# Patient Record
Sex: Female | Born: 1985 | Race: White | Hispanic: No | Marital: Single | Smoking: Current every day smoker
Health system: Southern US, Community
[De-identification: ages and names within clinical notes are randomized; demographics above are authoritative.]

## PROBLEM LIST (undated history)

## (undated) DIAGNOSIS — F419 Anxiety disorder, unspecified: Secondary | ICD-10-CM

## (undated) DIAGNOSIS — J45909 Unspecified asthma, uncomplicated: Secondary | ICD-10-CM

## (undated) DIAGNOSIS — N96 Recurrent pregnancy loss: Secondary | ICD-10-CM

## (undated) DIAGNOSIS — K219 Gastro-esophageal reflux disease without esophagitis: Secondary | ICD-10-CM

## (undated) HISTORY — PX: ABDOMINAL HYSTERECTOMY: SHX81

## (undated) HISTORY — PX: TUBAL LIGATION: SHX77

---

## 2010-02-12 ENCOUNTER — Emergency Department: Payer: Self-pay | Admitting: Unknown Physician Specialty

## 2011-01-08 ENCOUNTER — Inpatient Hospital Stay: Payer: Self-pay | Admitting: Internal Medicine

## 2011-01-11 ENCOUNTER — Emergency Department: Payer: Self-pay | Admitting: Unknown Physician Specialty

## 2011-06-26 ENCOUNTER — Emergency Department: Payer: Self-pay | Admitting: *Deleted

## 2011-08-16 ENCOUNTER — Emergency Department: Payer: Self-pay | Admitting: *Deleted

## 2011-08-16 LAB — PREGNANCY, URINE: Pregnancy Test, Urine: NEGATIVE m[IU]/mL

## 2011-08-16 LAB — COMPREHENSIVE METABOLIC PANEL
Alkaline Phosphatase: 74 U/L (ref 50–136)
Anion Gap: 12 (ref 7–16)
BUN: 13 mg/dL (ref 7–18)
Bilirubin,Total: 1.2 mg/dL — ABNORMAL HIGH (ref 0.2–1.0)
Calcium, Total: 9.1 mg/dL (ref 8.5–10.1)
Chloride: 108 mmol/L — ABNORMAL HIGH (ref 98–107)
Co2: 20 mmol/L — ABNORMAL LOW (ref 21–32)
Creatinine: 0.6 mg/dL (ref 0.60–1.30)
EGFR (African American): >60
Osmolality: 281 (ref 275–301)
Potassium: 5 mmol/L (ref 3.5–5.1)
SGPT (ALT): 17 U/L

## 2011-08-16 LAB — URINALYSIS, COMPLETE
Bilirubin,UR: NEGATIVE
Leukocyte Esterase: NEGATIVE
Ph: 5 (ref 4.5–8.0)
Protein: 30
RBC,UR: 1 /HPF (ref 0–5)
Specific Gravity: 1.026 (ref 1.003–1.030)
Squamous Epithelial: 29

## 2011-08-16 LAB — CBC
HGB: 15 g/dL (ref 12.0–16.0)
MCH: 32.4 pg (ref 26.0–34.0)
Platelet: 280 10*3/uL (ref 150–440)
RBC: 4.63 10*6/uL (ref 3.80–5.20)
RDW: 13.6 % (ref 11.5–14.5)

## 2011-08-16 LAB — LIPASE, BLOOD: Lipase: 115 U/L (ref 73–393)

## 2011-08-19 ENCOUNTER — Emergency Department: Payer: Self-pay | Admitting: Emergency Medicine

## 2011-08-19 LAB — URINALYSIS, COMPLETE
Bilirubin,UR: NEGATIVE
Blood: NEGATIVE
Glucose,UR: NEGATIVE mg/dL (ref 0–75)
Nitrite: NEGATIVE
RBC,UR: NONE SEEN /HPF (ref 0–5)
Squamous Epithelial: 41
WBC UR: 6 /HPF (ref 0–5)

## 2011-08-20 LAB — COMPREHENSIVE METABOLIC PANEL
Albumin: 2.8 g/dL — ABNORMAL LOW (ref 3.4–5.0)
Alkaline Phosphatase: 48 U/L — ABNORMAL LOW (ref 50–136)
Anion Gap: 10 (ref 7–16)
Bilirubin,Total: 0.4 mg/dL (ref 0.2–1.0)
Calcium, Total: 7.9 mg/dL — ABNORMAL LOW (ref 8.5–10.1)
Creatinine: 0.61 mg/dL (ref 0.60–1.30)
Glucose: 79 mg/dL (ref 65–99)
Osmolality: 282 (ref 275–301)
Potassium: 3.7 mmol/L (ref 3.5–5.1)
Sodium: 143 mmol/L (ref 136–145)
Total Protein: 6.4 g/dL (ref 6.4–8.2)

## 2011-08-20 LAB — CBC
HGB: 12.2 g/dL (ref 12.0–16.0)
MCHC: 33.9 g/dL (ref 32.0–36.0)
Platelet: 213 10*3/uL (ref 150–440)
RBC: 3.73 10*6/uL — ABNORMAL LOW (ref 3.80–5.20)
WBC: 6.9 10*3/uL (ref 3.6–11.0)

## 2011-08-20 LAB — PREGNANCY, URINE: Pregnancy Test, Urine: NEGATIVE m[IU]/mL

## 2011-12-24 ENCOUNTER — Emergency Department: Payer: Self-pay | Admitting: Emergency Medicine

## 2011-12-24 LAB — URINALYSIS, COMPLETE
Glucose,UR: NEGATIVE mg/dL (ref 0–75)
Nitrite: NEGATIVE
Ph: 9 (ref 4.5–8.0)
Protein: >=500
Specific Gravity: 1.027 (ref 1.003–1.030)
Squamous Epithelial: 2
WBC UR: 15 /HPF (ref 0–5)

## 2011-12-24 LAB — CBC
HCT: 43.5 % (ref 35.0–47.0)
MCH: 32.1 pg (ref 26.0–34.0)
MCHC: 33.3 g/dL (ref 32.0–36.0)
Platelet: 278 10*3/uL (ref 150–440)
RDW: 12.8 % (ref 11.5–14.5)

## 2011-12-24 LAB — COMPREHENSIVE METABOLIC PANEL
Anion Gap: 11 (ref 7–16)
BUN: 10 mg/dL (ref 7–18)
Chloride: 110 mmol/L — ABNORMAL HIGH (ref 98–107)
EGFR (African American): >60
EGFR (Non-African Amer.): >60
Glucose: 116 mg/dL — ABNORMAL HIGH (ref 65–99)
Osmolality: 281 (ref 275–301)
SGOT(AST): 11 U/L — ABNORMAL LOW (ref 15–37)
SGPT (ALT): 16 U/L
Total Protein: 8 g/dL (ref 6.4–8.2)

## 2011-12-24 LAB — LIPASE, BLOOD: Lipase: 66 U/L — ABNORMAL LOW (ref 73–393)

## 2012-01-05 ENCOUNTER — Emergency Department: Payer: Self-pay | Admitting: Emergency Medicine

## 2012-01-05 LAB — URINALYSIS, COMPLETE
Bacteria: NONE SEEN
Bilirubin,UR: NEGATIVE
Blood: NEGATIVE
Glucose,UR: NEGATIVE mg/dL (ref 0–75)
Ketone: NEGATIVE
Nitrite: NEGATIVE
Protein: NEGATIVE
Specific Gravity: 1.028 (ref 1.003–1.030)
WBC UR: NONE SEEN /HPF (ref 0–5)

## 2012-01-05 LAB — CBC
HGB: 12.9 g/dL (ref 12.0–16.0)
MCH: 32.1 pg (ref 26.0–34.0)
MCV: 98 fL (ref 80–100)
Platelet: 255 10*3/uL (ref 150–440)
RDW: 13 % (ref 11.5–14.5)
WBC: 10.1 10*3/uL (ref 3.6–11.0)

## 2012-01-05 LAB — HCG, QUANTITATIVE, PREGNANCY: Beta Hcg, Quant.: 195 m[IU]/mL — ABNORMAL HIGH

## 2012-01-05 LAB — WET PREP, GENITAL

## 2012-01-17 ENCOUNTER — Emergency Department: Payer: Self-pay | Admitting: Emergency Medicine

## 2012-02-25 ENCOUNTER — Emergency Department: Payer: Self-pay | Admitting: Emergency Medicine

## 2012-02-25 LAB — URINALYSIS, COMPLETE
Bacteria: NONE SEEN
Blood: NEGATIVE
Glucose,UR: NEGATIVE mg/dL (ref 0–75)
Leukocyte Esterase: NEGATIVE
Nitrite: NEGATIVE
Ph: 7 (ref 4.5–8.0)
Protein: NEGATIVE
WBC UR: 1 /HPF (ref 0–5)

## 2012-02-25 LAB — CBC
HCT: 36.3 % (ref 35.0–47.0)
HGB: 12.3 g/dL (ref 12.0–16.0)
MCH: 32.3 pg (ref 26.0–34.0)
RBC: 3.8 10*6/uL (ref 3.80–5.20)
WBC: 7.3 10*3/uL (ref 3.6–11.0)

## 2012-02-25 LAB — HCG, QUANTITATIVE, PREGNANCY: Beta Hcg, Quant.: 92467 m[IU]/mL — ABNORMAL HIGH

## 2012-03-18 ENCOUNTER — Emergency Department: Payer: Self-pay | Admitting: Emergency Medicine

## 2012-03-18 LAB — URINALYSIS, COMPLETE
Bilirubin,UR: NEGATIVE
Blood: NEGATIVE
Ketone: NEGATIVE
Ph: 6 (ref 4.5–8.0)
Protein: NEGATIVE
Specific Gravity: 1.025 (ref 1.003–1.030)
Squamous Epithelial: 20
WBC UR: 2 /HPF (ref 0–5)

## 2012-04-03 ENCOUNTER — Emergency Department: Payer: Self-pay | Admitting: Emergency Medicine

## 2012-04-03 LAB — COMPREHENSIVE METABOLIC PANEL
Alkaline Phosphatase: 78 U/L (ref 50–136)
Bilirubin,Total: 0.5 mg/dL (ref 0.2–1.0)
Chloride: 106 mmol/L (ref 98–107)
Co2: 24 mmol/L (ref 21–32)
Creatinine: 0.46 mg/dL — ABNORMAL LOW (ref 0.60–1.30)
EGFR (African American): >60
EGFR (Non-African Amer.): >60
Glucose: 69 mg/dL (ref 65–99)
SGOT(AST): 24 U/L (ref 15–37)
SGPT (ALT): 15 U/L (ref 12–78)
Total Protein: 7.3 g/dL (ref 6.4–8.2)

## 2012-04-03 LAB — URINALYSIS, COMPLETE
Bilirubin,UR: NEGATIVE
Ketone: NEGATIVE
Leukocyte Esterase: NEGATIVE
Nitrite: NEGATIVE
Ph: 8 (ref 4.5–8.0)
Protein: NEGATIVE
RBC,UR: NONE SEEN /HPF (ref 0–5)
Specific Gravity: 1.015 (ref 1.003–1.030)

## 2012-04-03 LAB — CBC
HCT: 35.4 % (ref 35.0–47.0)
MCH: 33 pg (ref 26.0–34.0)
MCHC: 34.9 g/dL (ref 32.0–36.0)
MCV: 95 fL (ref 80–100)
RBC: 3.75 10*6/uL — ABNORMAL LOW (ref 3.80–5.20)
RDW: 12.5 % (ref 11.5–14.5)

## 2012-04-03 LAB — WET PREP, GENITAL

## 2012-04-03 LAB — HCG, QUANTITATIVE, PREGNANCY: Beta Hcg, Quant.: 28913 m[IU]/mL — ABNORMAL HIGH

## 2012-04-03 LAB — LIPASE, BLOOD: Lipase: 77 U/L (ref 73–393)

## 2012-05-06 ENCOUNTER — Emergency Department: Payer: Self-pay | Admitting: *Deleted

## 2012-05-07 ENCOUNTER — Emergency Department: Payer: Self-pay | Admitting: Emergency Medicine

## 2012-05-07 LAB — URINALYSIS, COMPLETE
Bilirubin,UR: NEGATIVE
Blood: NEGATIVE
Ph: 6 (ref 4.5–8.0)
Protein: NEGATIVE
RBC,UR: 7 /HPF (ref 0–5)
Specific Gravity: 1.024 (ref 1.003–1.030)

## 2012-06-25 ENCOUNTER — Observation Stay: Payer: Self-pay

## 2012-07-11 ENCOUNTER — Emergency Department: Payer: Self-pay | Admitting: Emergency Medicine

## 2012-07-11 LAB — URINALYSIS, COMPLETE
Blood: NEGATIVE
Ketone: NEGATIVE
Leukocyte Esterase: NEGATIVE
Nitrite: NEGATIVE
Ph: 7 (ref 4.5–8.0)
Protein: NEGATIVE
RBC,UR: 2 /HPF (ref 0–5)
Specific Gravity: 1.019 (ref 1.003–1.030)
Squamous Epithelial: 9

## 2012-07-11 LAB — CBC WITH DIFFERENTIAL/PLATELET
Basophil #: 0 10*3/uL (ref 0.0–0.1)
Basophil %: 0.3 %
HCT: 35.9 % (ref 35.0–47.0)
HGB: 12.5 g/dL (ref 12.0–16.0)
Lymphocyte #: 1.6 10*3/uL (ref 1.0–3.6)
MCHC: 35 g/dL (ref 32.0–36.0)
MCV: 97 fL (ref 80–100)
Monocyte %: 5.9 %
Neutrophil #: 6.6 10*3/uL — ABNORMAL HIGH (ref 1.4–6.5)
Neutrophil %: 74.4 %
RBC: 3.69 10*6/uL — ABNORMAL LOW (ref 3.80–5.20)

## 2012-07-11 LAB — COMPREHENSIVE METABOLIC PANEL
Albumin: 2.4 g/dL — ABNORMAL LOW (ref 3.4–5.0)
Alkaline Phosphatase: 88 U/L (ref 50–136)
Anion Gap: 6 — ABNORMAL LOW (ref 7–16)
Bilirubin,Total: 0.3 mg/dL (ref 0.2–1.0)
Calcium, Total: 8.3 mg/dL — ABNORMAL LOW (ref 8.5–10.1)
Co2: 24 mmol/L (ref 21–32)
EGFR (African American): >60
EGFR (Non-African Amer.): >60
Osmolality: 272 (ref 275–301)
SGOT(AST): 24 U/L (ref 15–37)
Sodium: 137 mmol/L (ref 136–145)
Total Protein: 6.2 g/dL — ABNORMAL LOW (ref 6.4–8.2)

## 2012-07-14 ENCOUNTER — Inpatient Hospital Stay: Payer: Self-pay | Admitting: Obstetrics and Gynecology

## 2012-07-14 LAB — CBC
HCT: 43.6 % (ref 35.0–47.0)
MCH: 33.1 pg (ref 26.0–34.0)
MCHC: 33.9 g/dL (ref 32.0–36.0)
MCV: 98 fL (ref 80–100)
Platelet: 158 10*3/uL (ref 150–440)
RBC: 4.46 10*6/uL (ref 3.80–5.20)
RDW: 13.1 % (ref 11.5–14.5)
WBC: 13.9 10*3/uL — ABNORMAL HIGH (ref 3.6–11.0)

## 2012-07-14 LAB — URINALYSIS, COMPLETE
Blood: NEGATIVE
Leukocyte Esterase: NEGATIVE
Protein: 100
RBC,UR: 2 /HPF (ref 0–5)
Specific Gravity: 1.029 (ref 1.003–1.030)

## 2012-07-14 LAB — COMPREHENSIVE METABOLIC PANEL
Alkaline Phosphatase: 118 U/L (ref 50–136)
Anion Gap: 12 (ref 7–16)
Bilirubin,Total: 0.4 mg/dL (ref 0.2–1.0)
Co2: 18 mmol/L — ABNORMAL LOW (ref 21–32)
EGFR (African American): >60
Osmolality: 274 (ref 275–301)
SGPT (ALT): 64 U/L (ref 12–78)
Sodium: 137 mmol/L (ref 136–145)
Total Protein: 7.9 g/dL (ref 6.4–8.2)

## 2012-07-14 LAB — RAPID URINE DRUG SCREEN, HOSP PERFORMED
Amphetamines, Ur Screen: NEGATIVE (ref ?–1000)
Barbiturates, Ur Screen: NEGATIVE (ref ?–200)
Benzodiazepine, Ur Scrn: NEGATIVE (ref ?–200)
Cannabinoid 50 Ng, Ur ~~LOC~~: POSITIVE (ref ?–50)
Cocaine Metabolite,Ur ~~LOC~~: NEGATIVE (ref ?–300)
Opiate, Ur Screen: NEGATIVE (ref ?–300)

## 2012-07-14 LAB — LIPASE, BLOOD: Lipase: 239 U/L (ref 73–393)

## 2012-07-14 LAB — PROTEIN / CREATININE RATIO, URINE
Creatinine, Urine: 193.1 mg/dL — ABNORMAL HIGH (ref 30.0–125.0)
Protein/Creat. Ratio: 186 mg/gCREAT (ref 0–200)

## 2012-07-14 LAB — RAPID INFLUENZA A&B ANTIGENS

## 2012-07-15 LAB — COMPREHENSIVE METABOLIC PANEL
Albumin: 2.2 g/dL — ABNORMAL LOW (ref 3.4–5.0)
Alkaline Phosphatase: 76 U/L (ref 50–136)
BUN: 14 mg/dL (ref 7–18)
Chloride: 111 mmol/L — ABNORMAL HIGH (ref 98–107)
Co2: 20 mmol/L — ABNORMAL LOW (ref 21–32)
EGFR (African American): >60
Glucose: 103 mg/dL — ABNORMAL HIGH (ref 65–99)
Osmolality: 286 (ref 275–301)
Potassium: 3.7 mmol/L (ref 3.5–5.1)
SGOT(AST): 41 U/L — ABNORMAL HIGH (ref 15–37)
SGPT (ALT): 41 U/L (ref 12–78)
Sodium: 143 mmol/L (ref 136–145)
Total Protein: 5.3 g/dL — ABNORMAL LOW (ref 6.4–8.2)

## 2012-07-15 LAB — CBC WITH DIFFERENTIAL/PLATELET
Basophil #: 0 10*3/uL (ref 0.0–0.1)
Basophil %: 0.1 %
HCT: 31.3 % — ABNORMAL LOW (ref 35.0–47.0)
Lymphocyte #: 1.2 10*3/uL (ref 1.0–3.6)
Lymphocyte %: 5.4 %
MCH: 33.2 pg (ref 26.0–34.0)
MCHC: 34 g/dL (ref 32.0–36.0)
MCV: 98 fL (ref 80–100)
Monocyte #: 0.7 x10 3/mm (ref 0.2–0.9)
Monocyte %: 3.2 %
Neutrophil %: 91.3 %
Platelet: 133 10*3/uL — ABNORMAL LOW (ref 150–440)
RBC: 3.21 10*6/uL — ABNORMAL LOW (ref 3.80–5.20)
RDW: 13.1 % (ref 11.5–14.5)

## 2012-07-15 LAB — URIC ACID: Uric Acid: 4.2 mg/dL (ref 2.6–6.0)

## 2012-07-16 LAB — PATHOLOGY REPORT

## 2012-12-31 IMAGING — CR DG ABDOMEN 3V
1 series · 5 of 5 positions shown · non-contrast
Comparison: none

REASON FOR EXAM: pain, vomiting
COMMENTS:

[Series 1: w chest pa · 0.14mm/px · 5 of 5 slices shown]
[im 1/5]
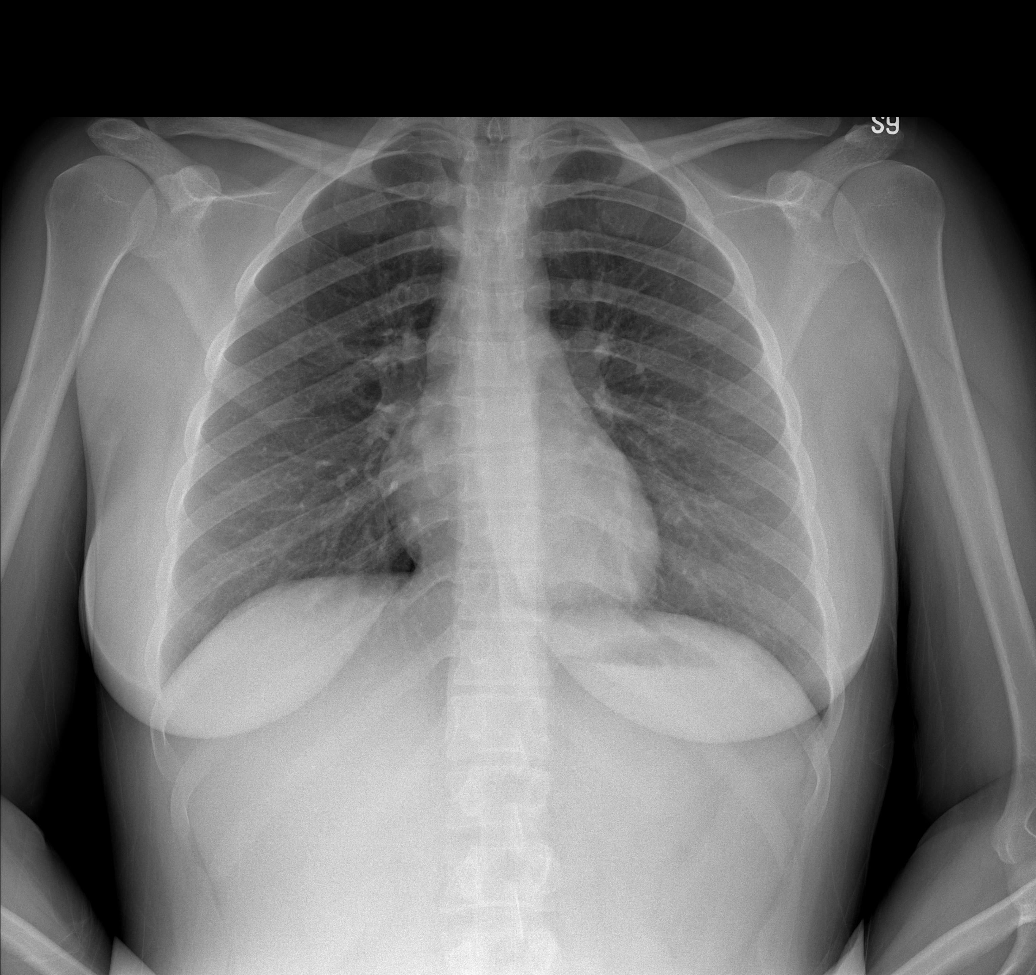
[im 2/5]
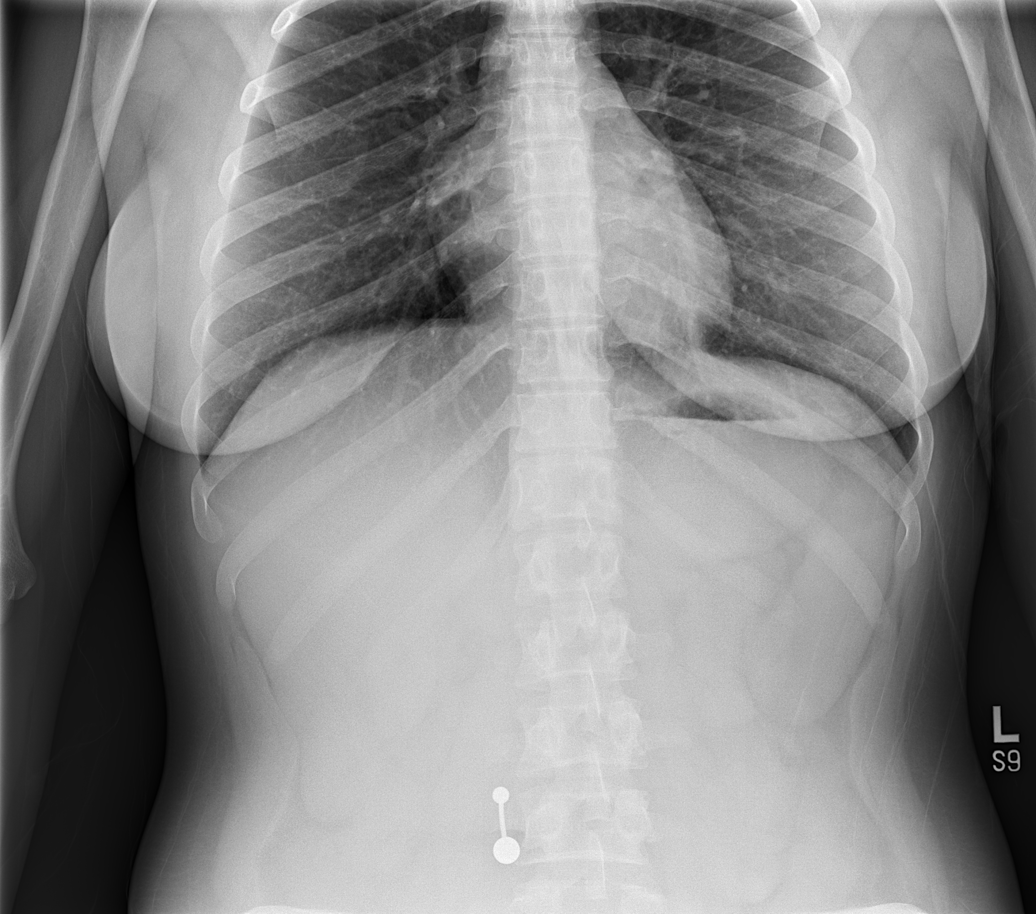
[im 3/5]
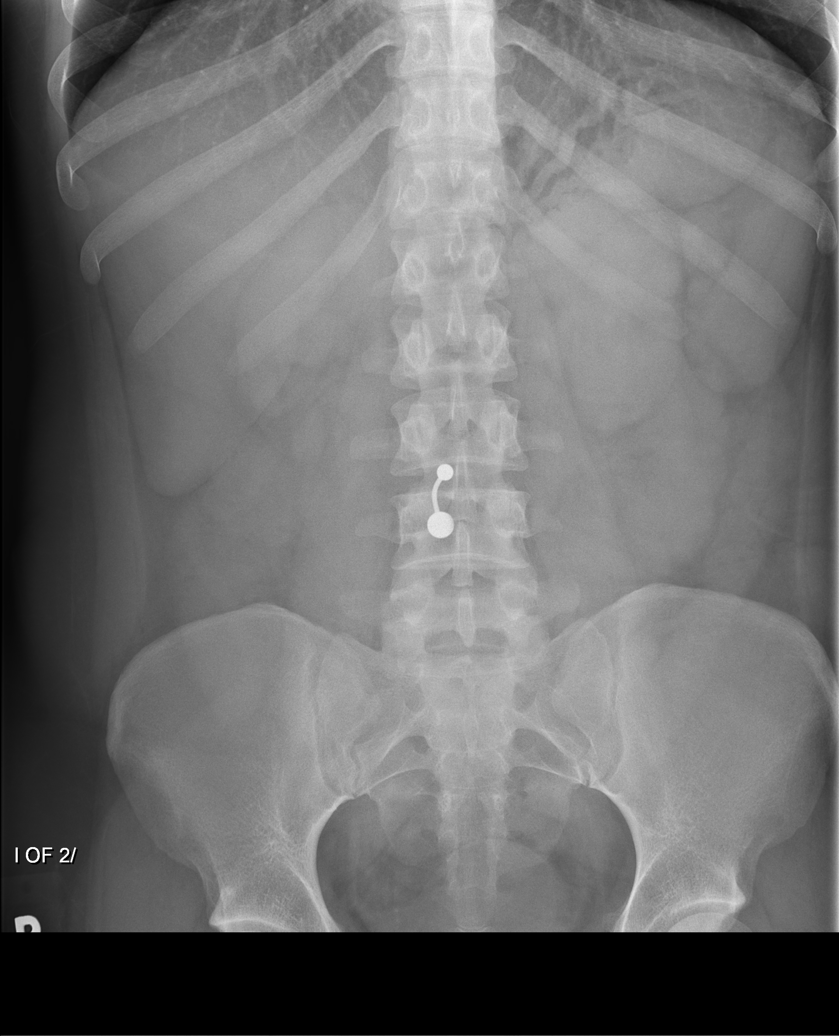
[im 4/5]
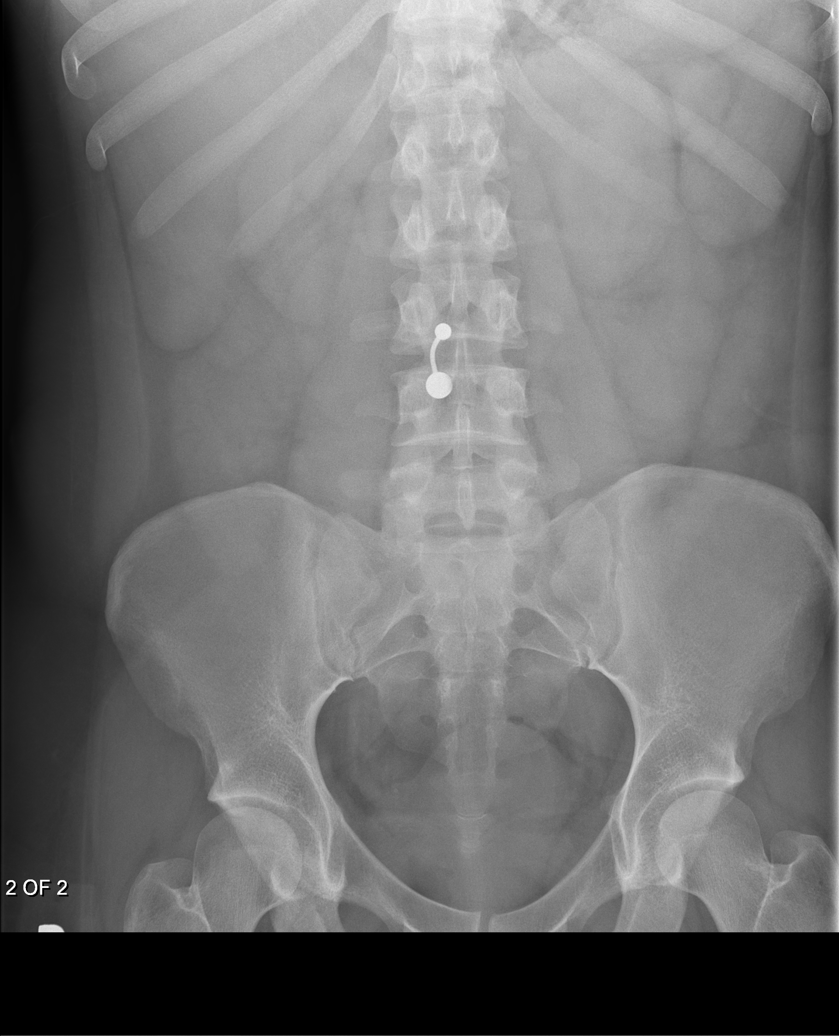
[im 5/5]
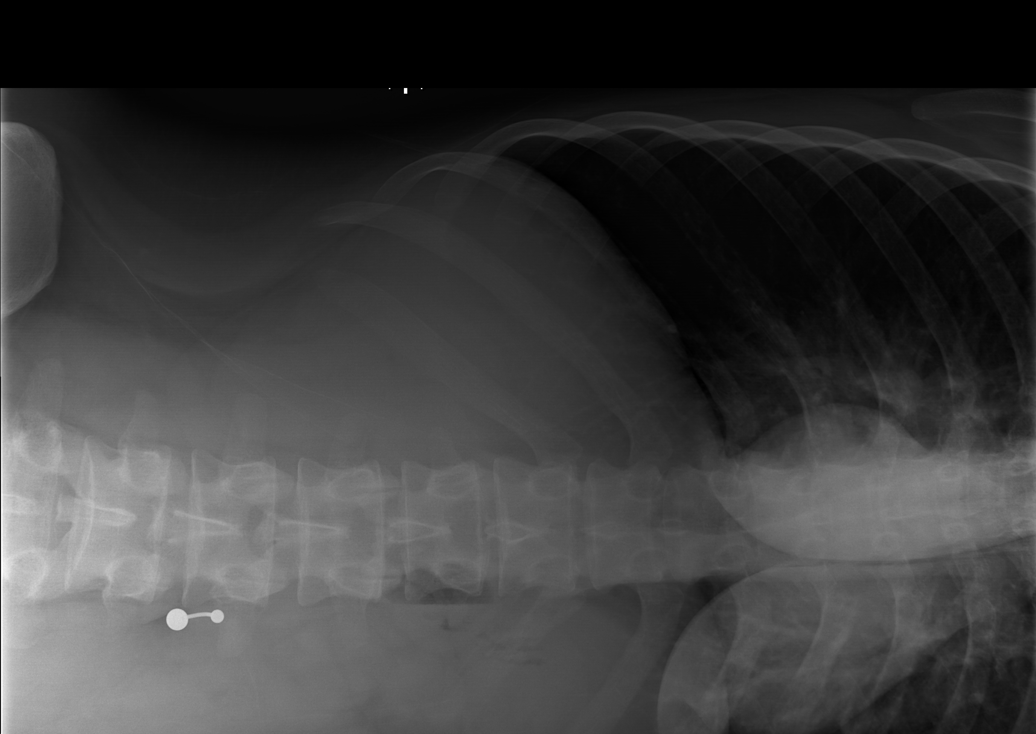

[5 of 5 positions shown; findings below may reference images not displayed]

PROCEDURE:     DXR - DXR ABDOMEN 3-WAY (INCL PA CXR)  - August 16, 2011  [DATE]

RESULT:     Comparison is made to the prior exam of 01/08/2011. The lung
fields are clear. Multiple views of the abdomen were obtained. No
subdiaphragmatic free air is seen. No dilated bowel loops are noted.
Overall, there is relative decrease in bowel gas. This is a nonspecific
finding that can be transient, secondary to gastric outlet obstruction or
associated with intra-abdominal inflammation such as pancreatitis. The
stomach appears fluid-filled with an air-fluid level noted in the gastric
fundus. No abnormal intraabdominal calcifications are seen. The osseous
structures are normal in appearance.
IMPRESSION: 1. The lung fields are clear.
2. No subdiaphragmatic free air is observed.
3. The abdomen is essentially airless. This finding is nonspecific but can
be associated with pancreatitis or with gastric outlet obstruction which are
mentioned for correlation with clinical and laboratory findings.

## 2013-03-19 LAB — URINALYSIS, COMPLETE
Bacteria: NONE SEEN
Blood: NEGATIVE
Glucose,UR: NEGATIVE mg/dL (ref 0–75)
Ketone: NEGATIVE
Ph: 6 (ref 4.5–8.0)
Protein: NEGATIVE
RBC,UR: 1 /HPF (ref 0–5)
Squamous Epithelial: 8

## 2013-03-19 LAB — COMPREHENSIVE METABOLIC PANEL
Albumin: 2.9 g/dL — ABNORMAL LOW (ref 3.4–5.0)
Anion Gap: 6 — ABNORMAL LOW (ref 7–16)
Bilirubin,Total: 0.2 mg/dL (ref 0.2–1.0)
Calcium, Total: 8.9 mg/dL (ref 8.5–10.1)
Co2: 24 mmol/L (ref 21–32)
Creatinine: 0.57 mg/dL — ABNORMAL LOW (ref 0.60–1.30)
EGFR (African American): >60
EGFR (Non-African Amer.): >60
Glucose: 93 mg/dL (ref 65–99)
Osmolality: 270 (ref 275–301)
Potassium: 3.5 mmol/L (ref 3.5–5.1)
Total Protein: 6.7 g/dL (ref 6.4–8.2)

## 2013-03-19 LAB — HCG, QUANTITATIVE, PREGNANCY: Beta Hcg, Quant.: 54663 m[IU]/mL — ABNORMAL HIGH

## 2013-03-19 LAB — CBC
HCT: 38 % (ref 35.0–47.0)
HGB: 13.2 g/dL (ref 12.0–16.0)
MCH: 32.2 pg (ref 26.0–34.0)
MCV: 92 fL (ref 80–100)
Platelet: 270 10*3/uL (ref 150–440)
RBC: 4.11 10*6/uL (ref 3.80–5.20)
WBC: 10.8 10*3/uL (ref 3.6–11.0)

## 2013-03-19 LAB — LIPASE, BLOOD: Lipase: 116 U/L (ref 73–393)

## 2013-03-20 ENCOUNTER — Ambulatory Visit: Payer: Self-pay | Admitting: Obstetrics and Gynecology

## 2013-05-05 DIAGNOSIS — F4481 Dissociative identity disorder: Secondary | ICD-10-CM

## 2013-05-05 DIAGNOSIS — F319 Bipolar disorder, unspecified: Secondary | ICD-10-CM

## 2013-05-05 DIAGNOSIS — N938 Other specified abnormal uterine and vaginal bleeding: Secondary | ICD-10-CM

## 2013-05-05 DIAGNOSIS — F39 Unspecified mood [affective] disorder: Secondary | ICD-10-CM

## 2013-05-05 DIAGNOSIS — F419 Anxiety disorder, unspecified: Secondary | ICD-10-CM | POA: Insufficient documentation

## 2013-05-05 DIAGNOSIS — R519 Headache, unspecified: Secondary | ICD-10-CM | POA: Insufficient documentation

## 2013-05-05 DIAGNOSIS — F4310 Post-traumatic stress disorder, unspecified: Secondary | ICD-10-CM | POA: Insufficient documentation

## 2013-05-05 HISTORY — DX: Bipolar disorder, unspecified: F31.9

## 2013-05-05 HISTORY — DX: Unspecified mood (affective) disorder: F39

## 2013-05-05 HISTORY — DX: Other specified abnormal uterine and vaginal bleeding: N93.8

## 2013-05-05 HISTORY — DX: Dissociative identity disorder: F44.81

## 2013-05-05 HISTORY — DX: Post-traumatic stress disorder, unspecified: F43.10

## 2013-07-17 IMAGING — US US OB LIMITED
1 series · 14 of 28 positions shown · non-contrast
Comparison: none

REASON FOR EXAM: fetal growth
COMMENTS:   May transport without cardiac monitor

PROCEDURE:     US  - US LIMITED OB  - July 14, 2012 [DATE]
RESULT:

[Series 1: us ob limited · 0.25mm/px · 14 of 29 slices shown]
[im 2/29]
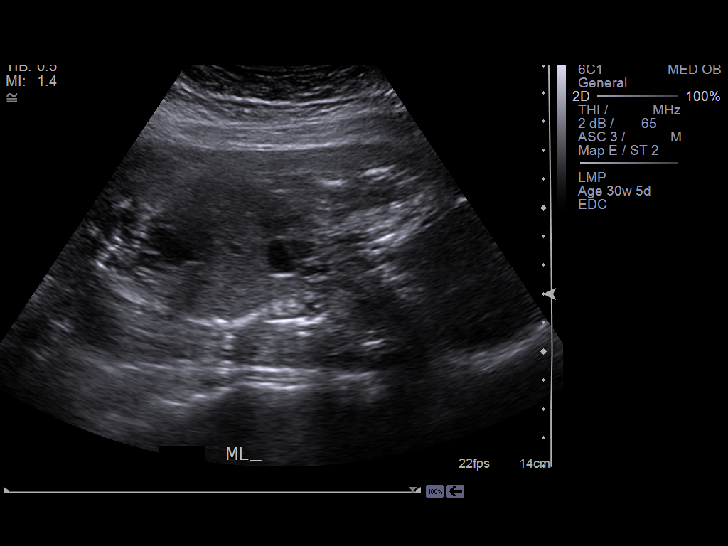
[im 4/29]
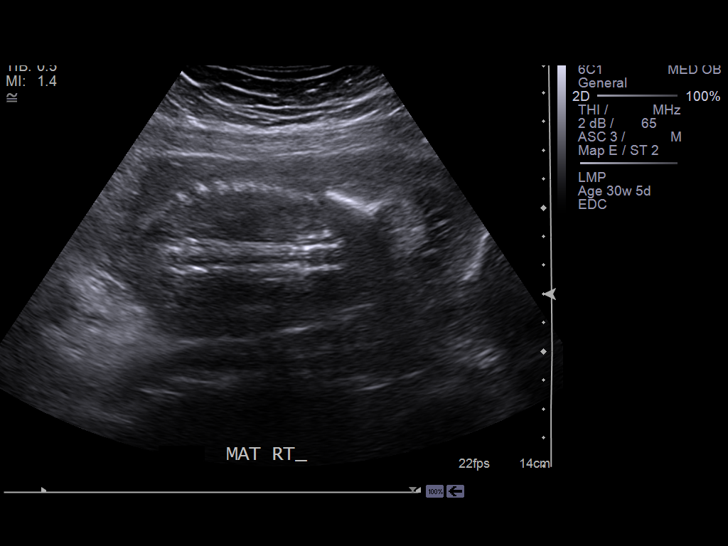
[im 6/29]
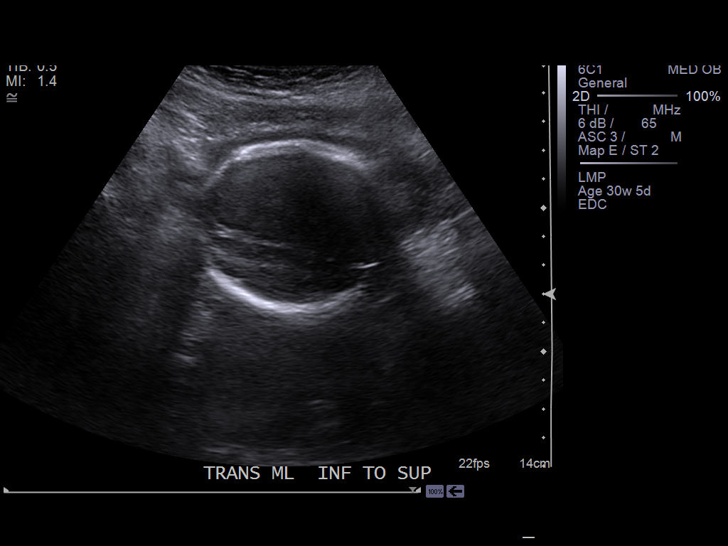
[im 8/29]
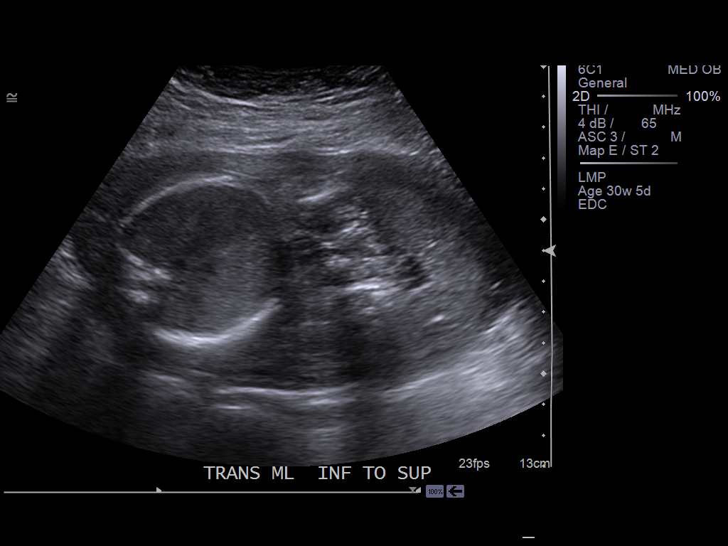
[im 10/29]
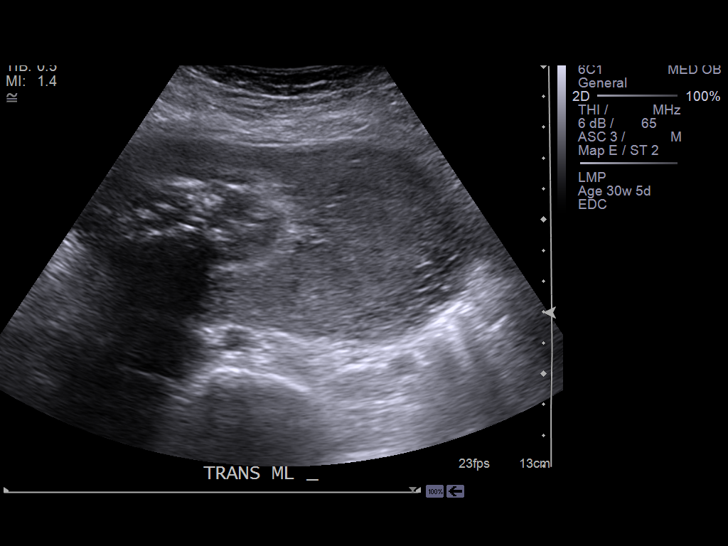
[im 12/29]
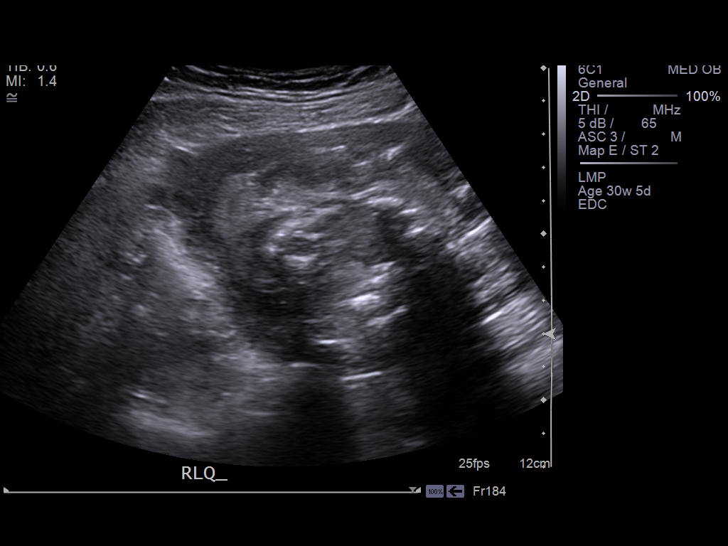
[im 14/29]
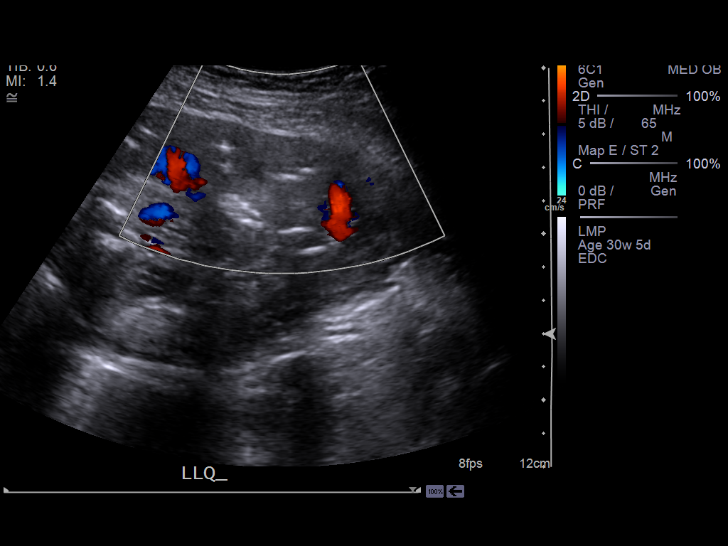
[im 16/29]
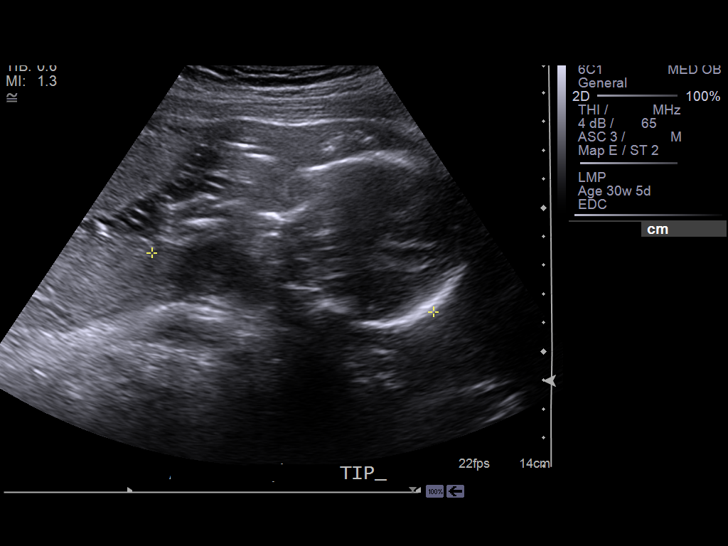
[im 18/29]
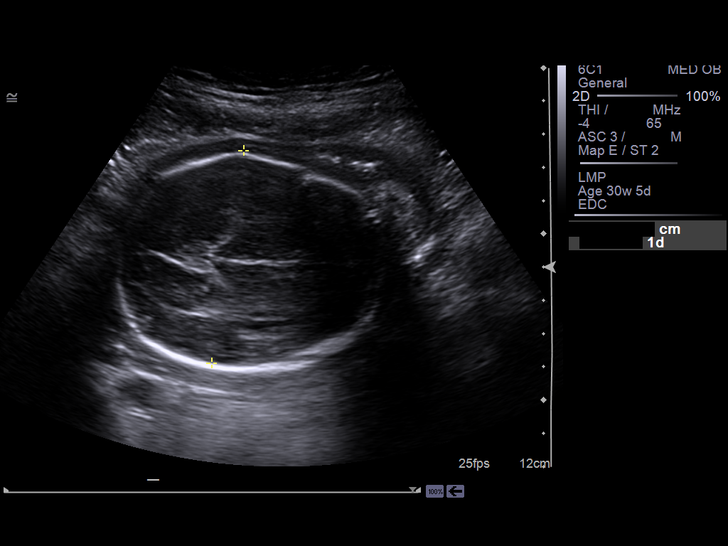
[im 20/29]
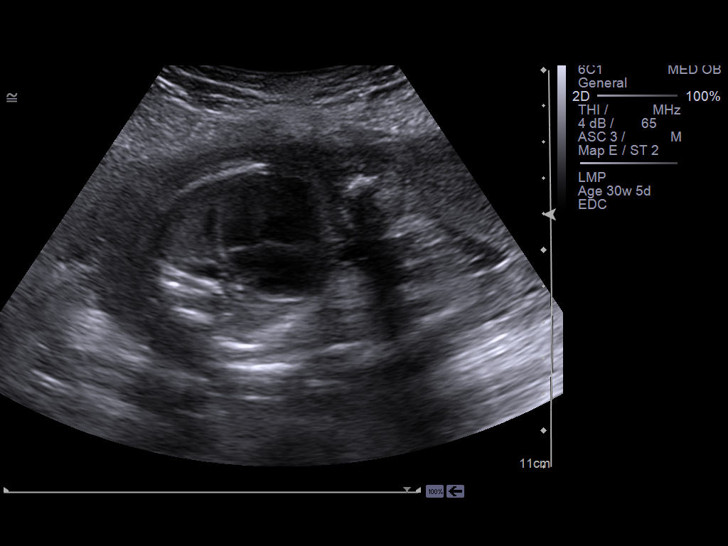
[im 22/29]
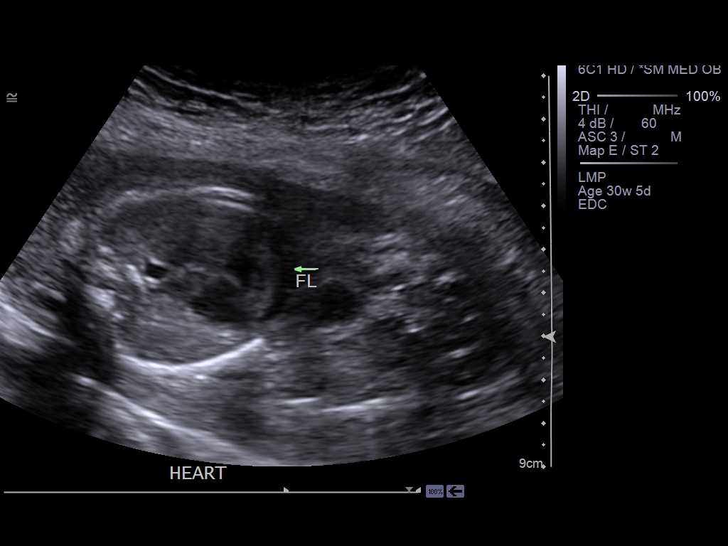
[im 24/29]
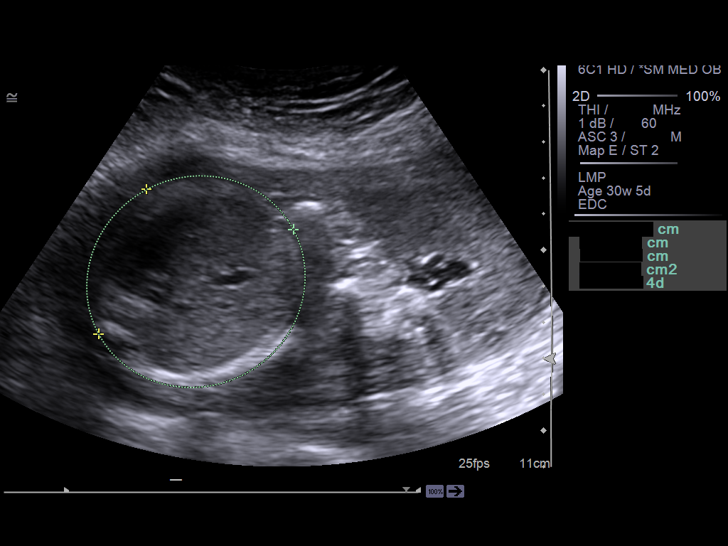
[im 26/29]
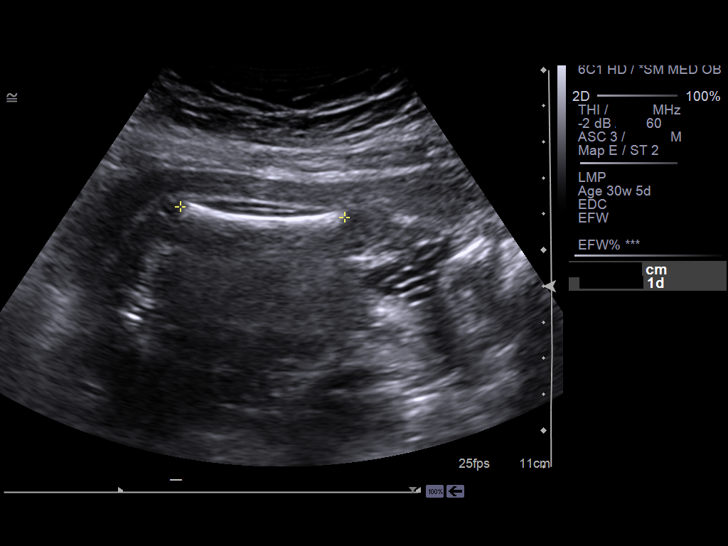
[im 29/29]
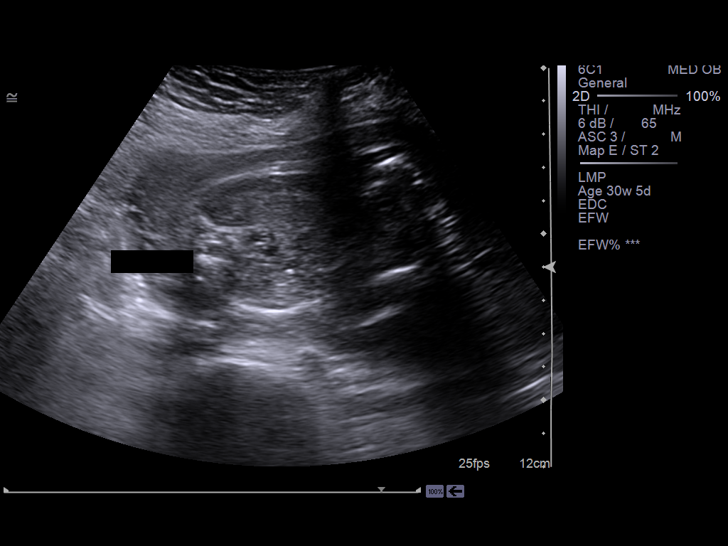

[14 of 28 positions shown; findings below may reference images not displayed]

FINDINGS: Limited Pelvic Ultrasound is performed. Transabdominal limited OB
assessment shows no amniotic fluid present consistent with severe
oligohydramnios. The cervix is closed with a length of 3.11 cm. The placenta
is fundal and to the patient's left measuring at least 10 cm from the most
inferior margin to the internal cervical os. Fetal heart rate is present
with a rate of 160 beats per minute. There is currently a cephalic
presentation. Current fetal measurements indicate gestational age of 25
weeks, 1 day with an ultrasound EDC 26 October, 2012 which is less than the
expected gestational age of 30 weeks, 5 days. Fetal weight is 667 grams + / -
99 grams by estimate. The fetal heart appears to be enlarged with
pericardial fluid present.
IMPRESSION: 1.  Severe oligohydramnios.
2.  Evidence of intrauterine growth retardation is present.
3.  Cardiomegaly and pericardial fluid present. High-risk OB assessment with
consideration of transfer to a tertiary care center is suggested.

[REDACTED]

## 2013-08-19 IMAGING — US US OB LIMITED
1 series · 13 of 28 positions shown · non-contrast
Comparison: none

REASON FOR EXAM: abd pain, discharge
COMMENTS:   May transport without cardiac monitor

[Series 1: us ob limited · 0.26mm/px · 13 of 43 slices shown]
[im 2/43]
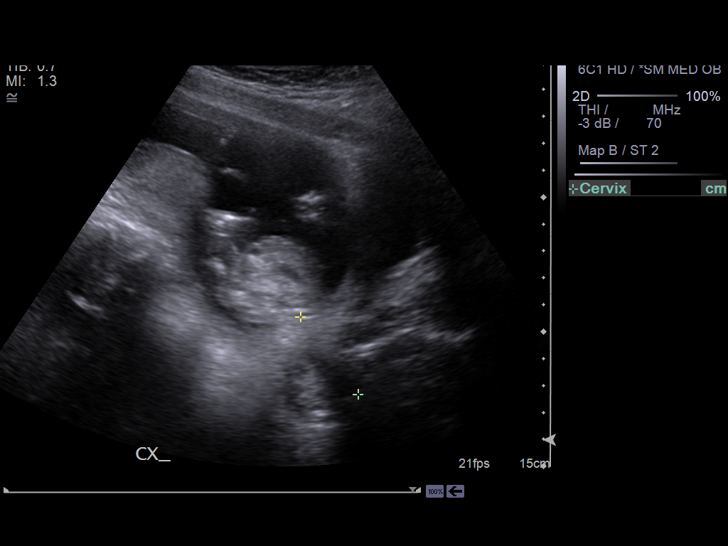
[im 5/43]
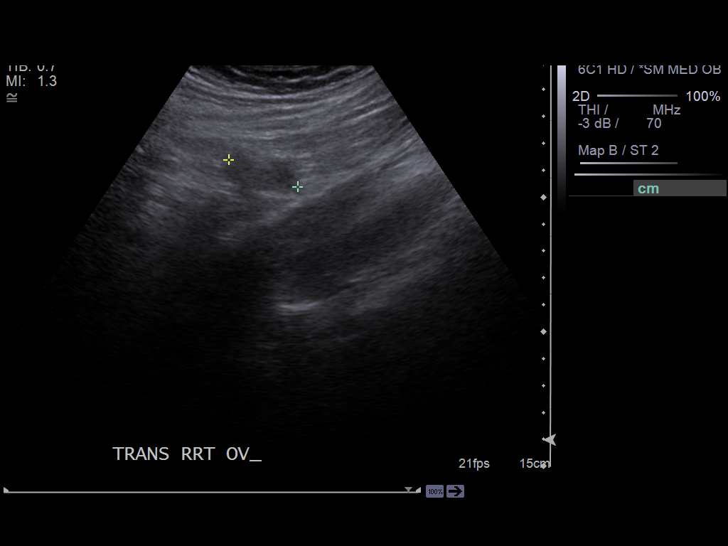
[im 8/43]
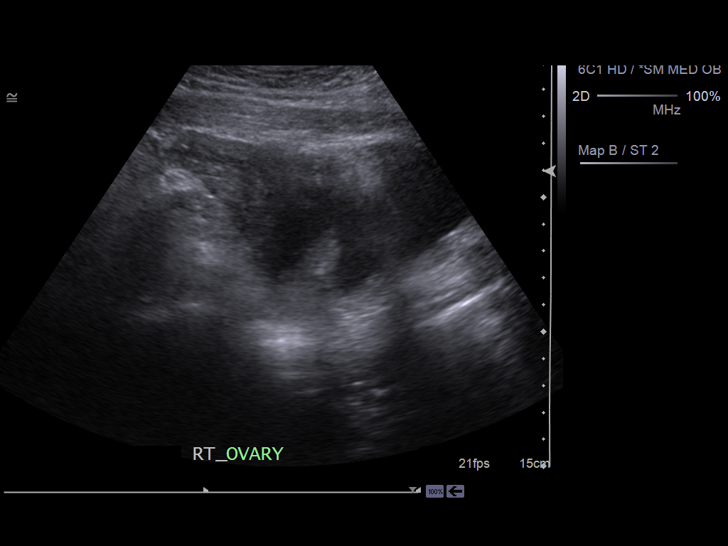
[im 11/43]
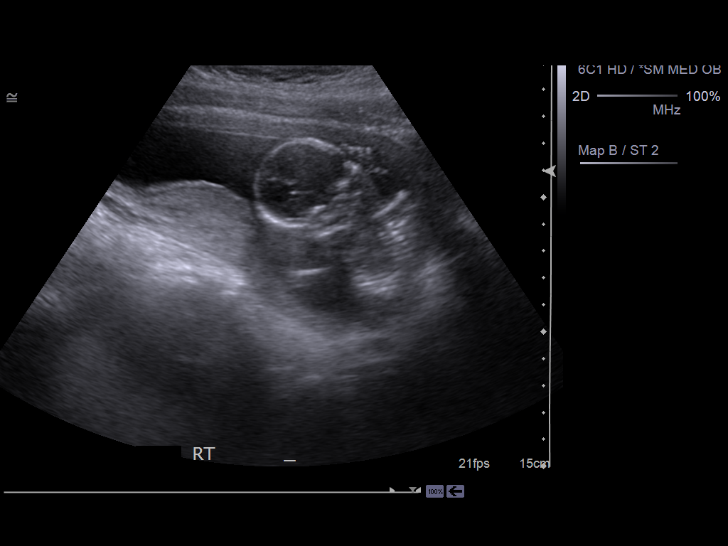
[im 15/43]
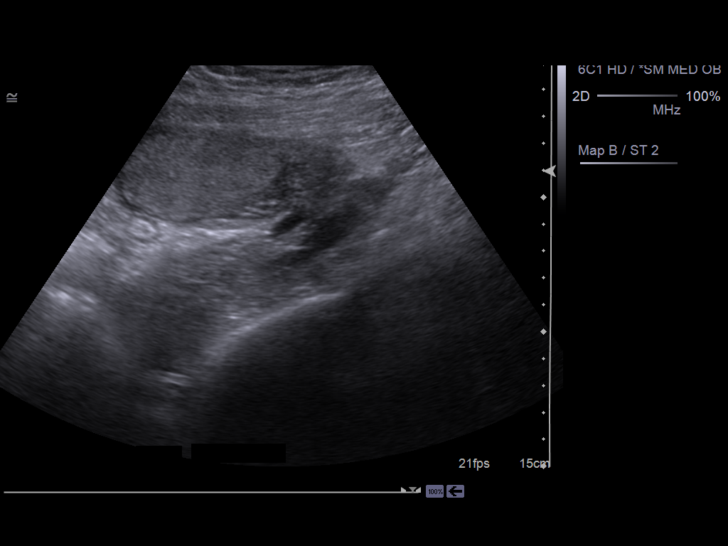
[im 18/43]
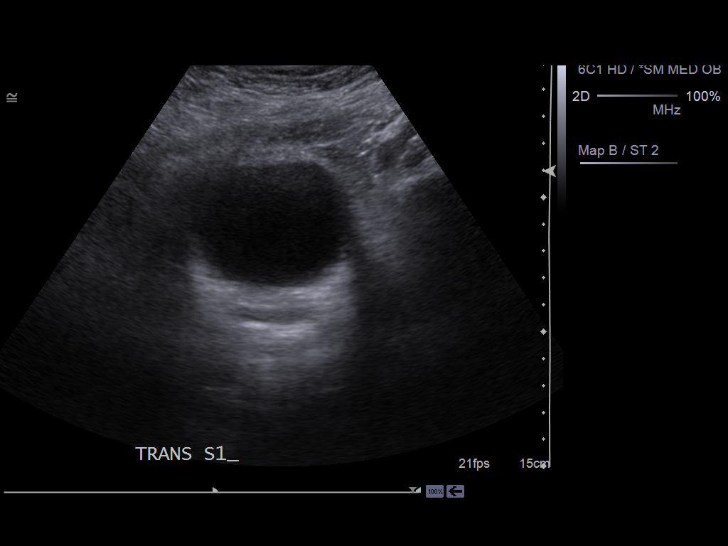
[im 22/43]
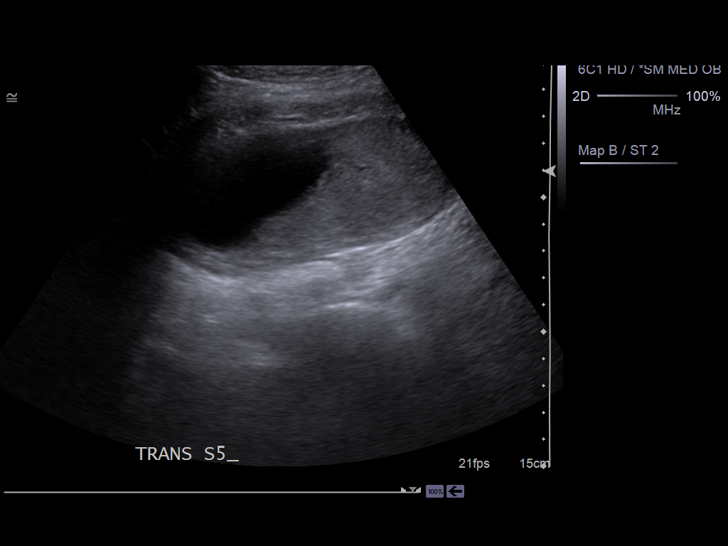
[im 25/43]
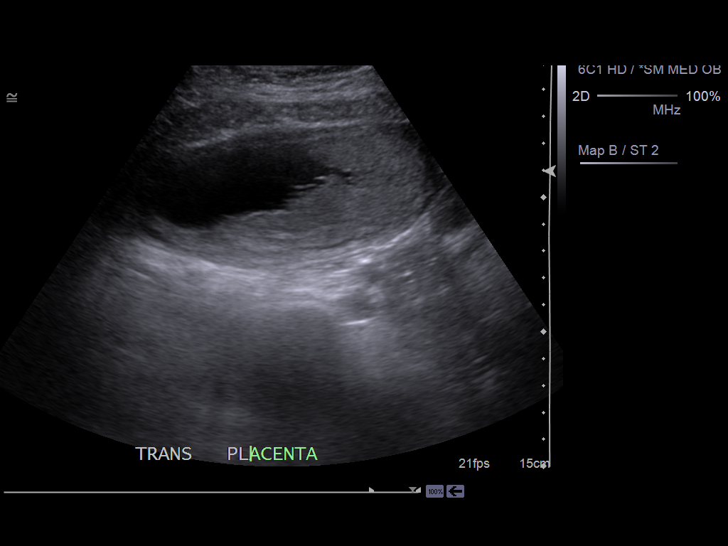
[im 29/43]
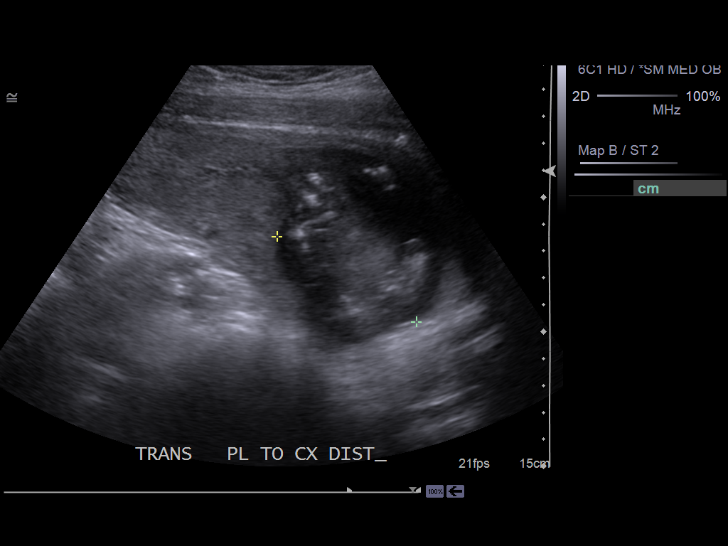
[im 32/43]
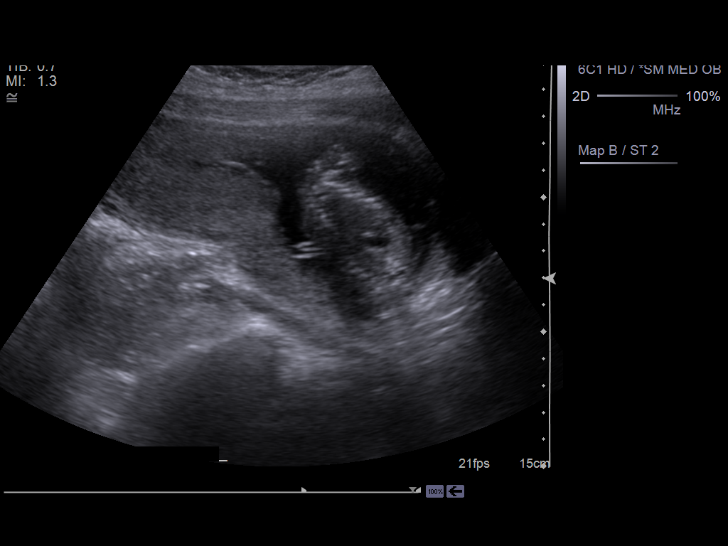
[im 35/43]
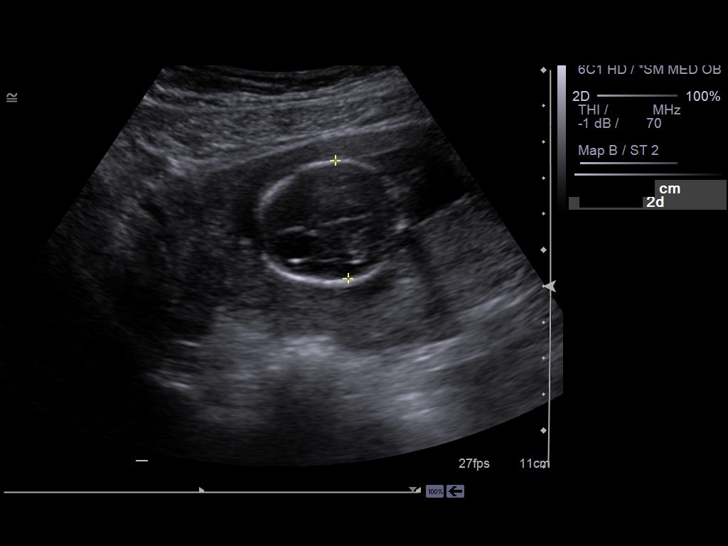
[im 38/43]
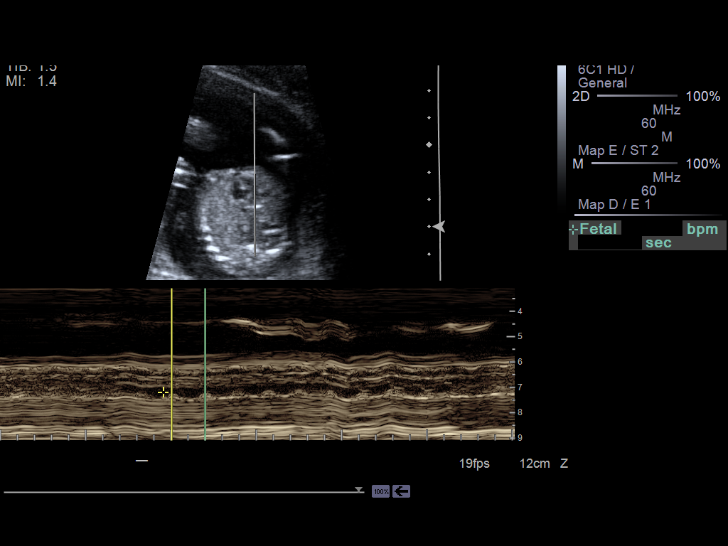
[im 41/43]
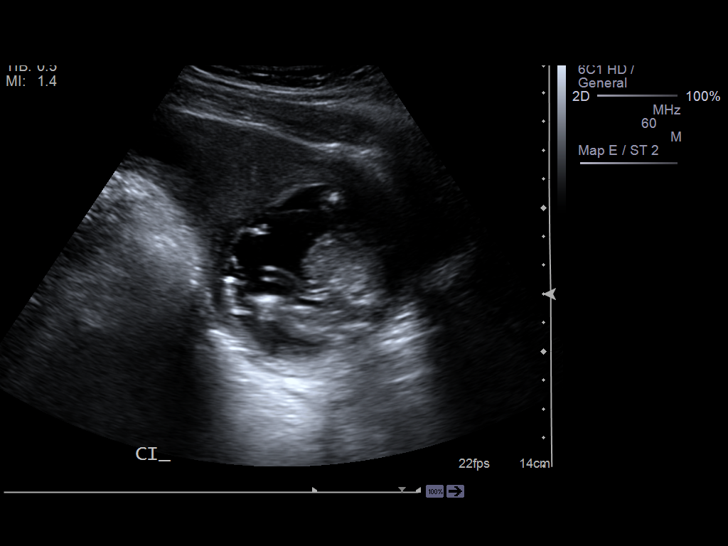

[13 of 28 positions shown; findings below may reference images not displayed]

PROCEDURE:     US  - US LIMITED OB  - April 03, 2012  [DATE]

RESULT:     The patient is reportedly 20 weeks pregnant and a limited OB
ultrasound for dating was requested.

There is a gravid uterus. The presentation is variable. The placenta is
fundal and left-sided. There is no evidence of a placenta previa. The
internal cervical os appears closed. The distance from the lower placental
segment to the internal cervical os is 6 cm.

Measured parameters:

BPD 3.33 cm corresponding to an EGA of 16 weeks 2 days
HC 12.28 cm corresponding to an EGA of 16 weeks one day
AC 10.4 there is centimeters corresponding to an EGA of 16 weeks 3 days
[HOSPITAL].09 cm corresponding to an EGA of 16 weeks 2 days
HL 1.95 cm corresponding to an EGA of 15 weeks 5 days
Estimated fetal weight 151 grams + / - 22 grams.

There is a cardiac rate of 153 beats per minute.
IMPRESSION: There is a viable IUP. The fetal presentation is variable.
The estimated gestational age is 16 weeks 1 day + / -10 days corresponding to
an estimated date of confinement 17 September, 2012. There is no evidence of
placental abruption.

## 2013-08-19 IMAGING — US ABDOMEN ULTRASOUND LIMITED
1 series · 14 of 25 positions shown · non-contrast
Comparison: none

REASON FOR EXAM: abd pain - all over, but mostly upper similar to prior
pancreatitis due to C dif
COMMENTS:   Body Site: GB and Fossa, CBD, Head of Pancreas

[Series 1: abdomen ultrasound limited · 0.31mm/px · 14 of 29 slices shown]
[im 1/29]
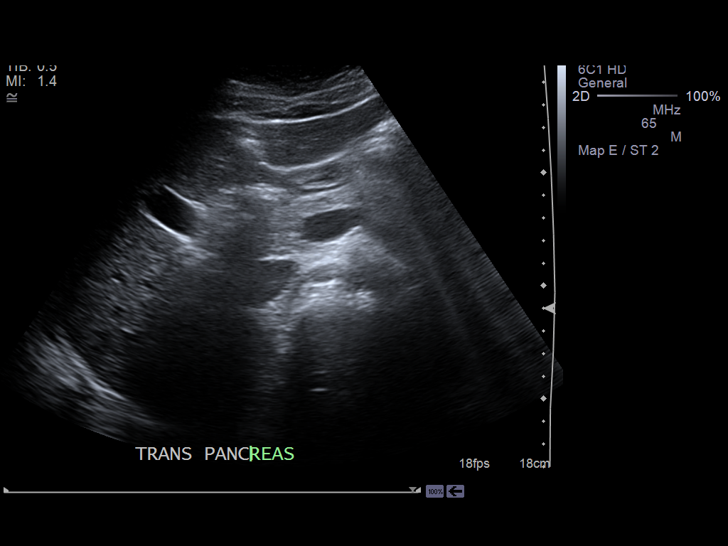
[im 3/29]
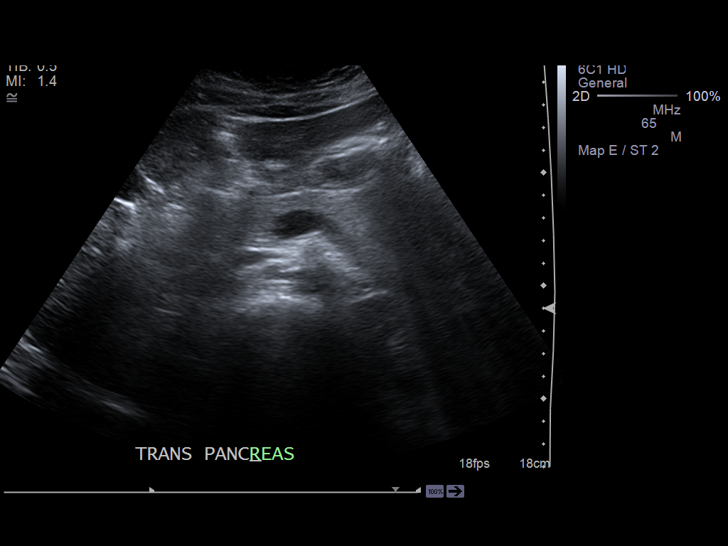
[im 5/29]
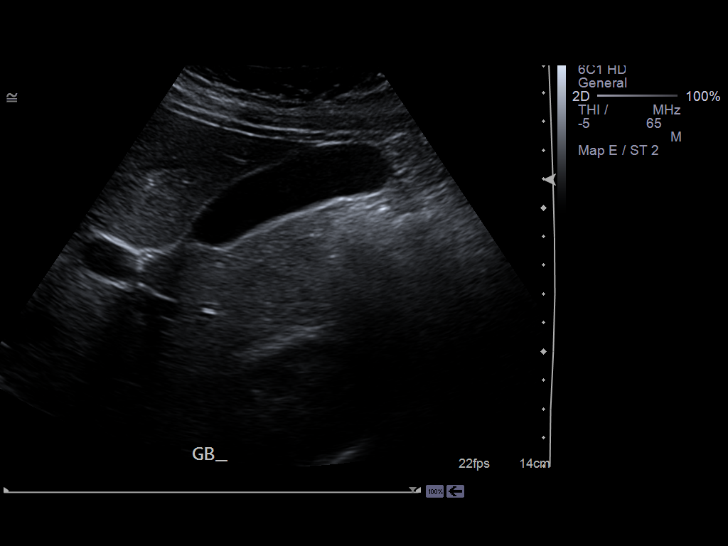
[im 8/29]
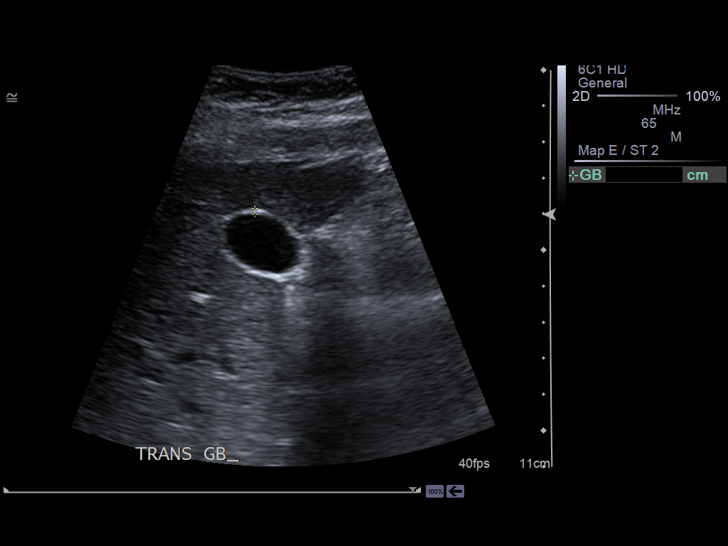
[im 10/29]
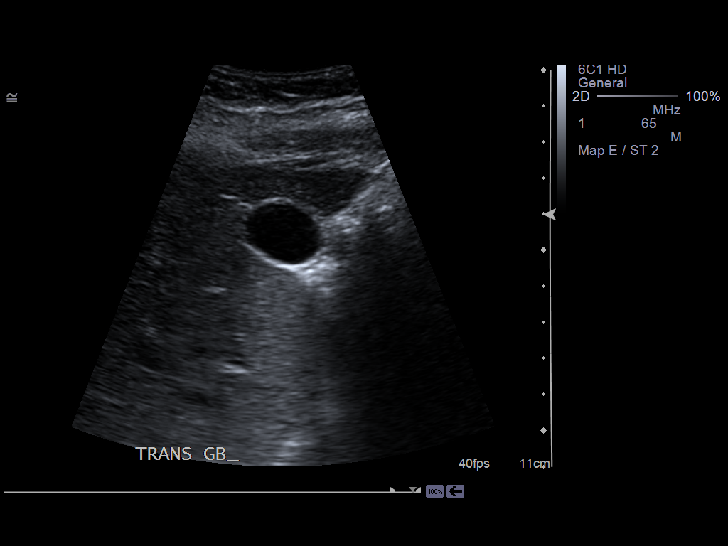
[im 11/29]
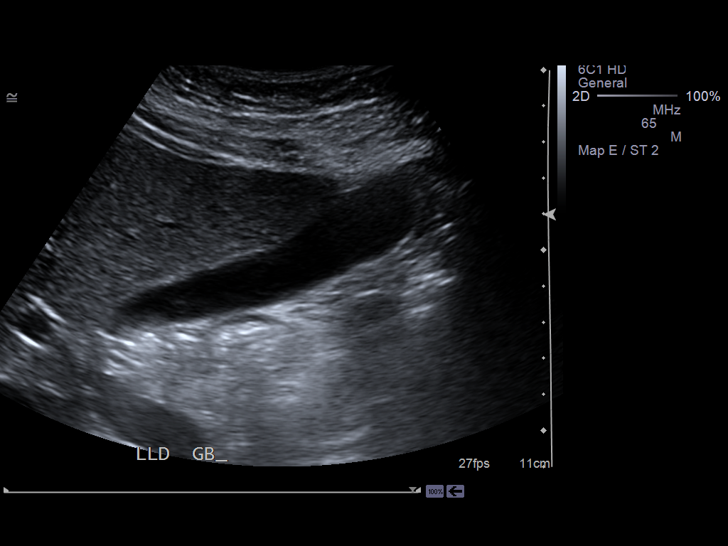
[im 13/29]
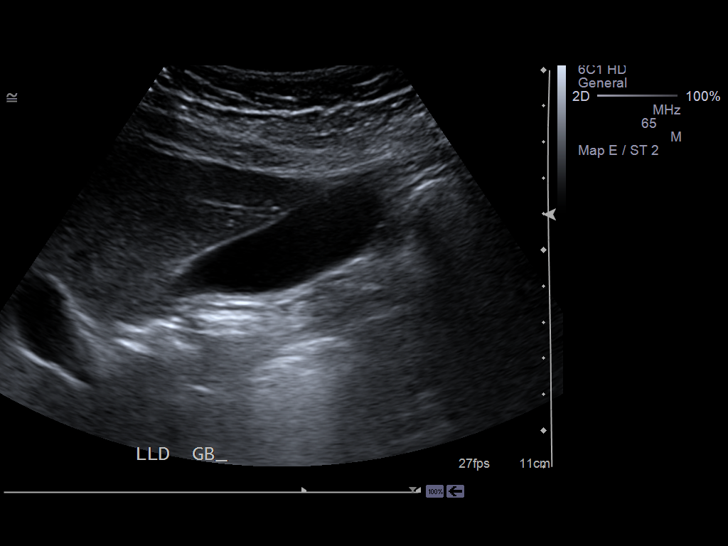
[im 16/29]
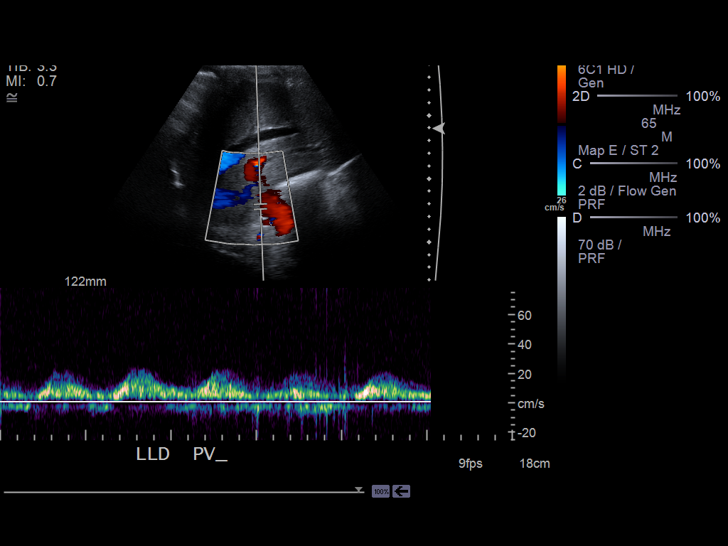
[im 18/29]
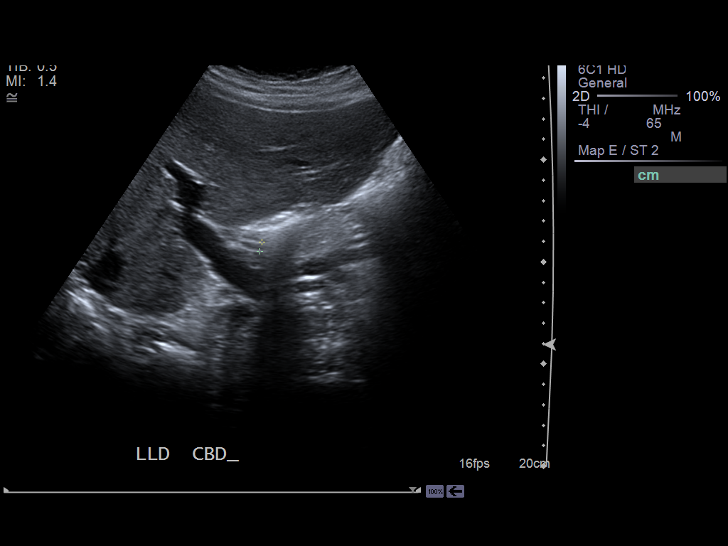
[im 19/29]
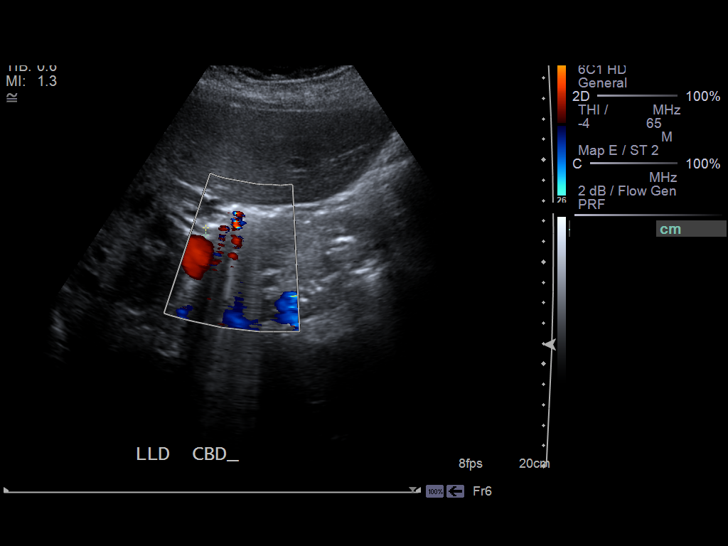
[im 22/29]
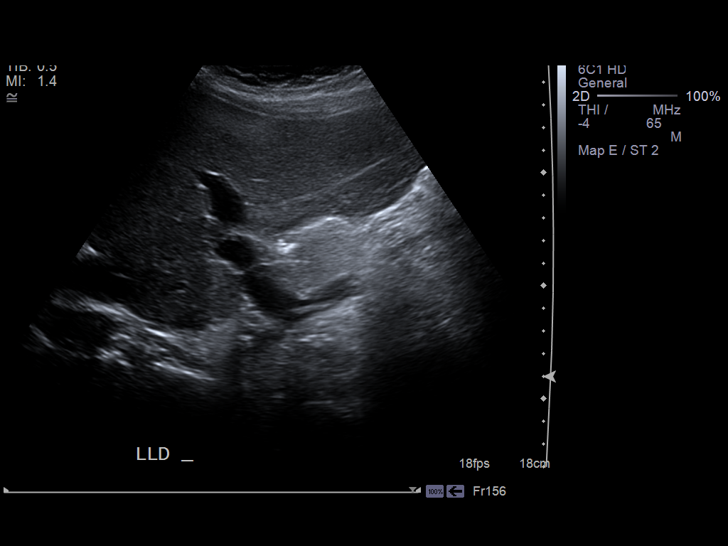
[im 24/29]
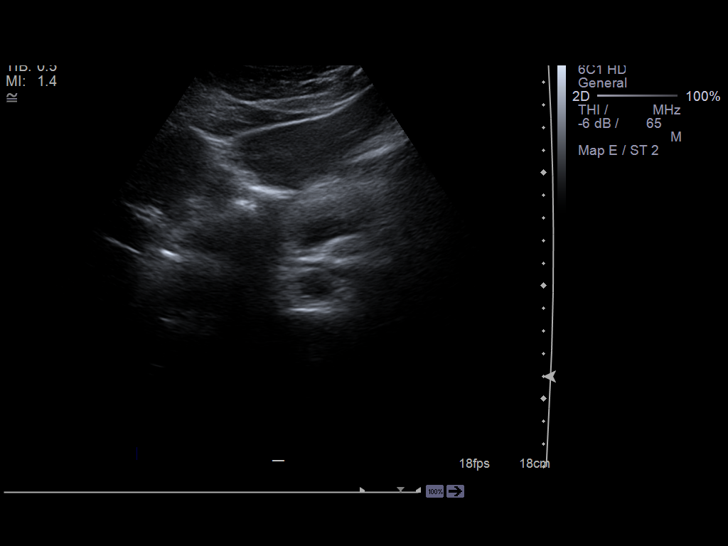
[im 26/29]
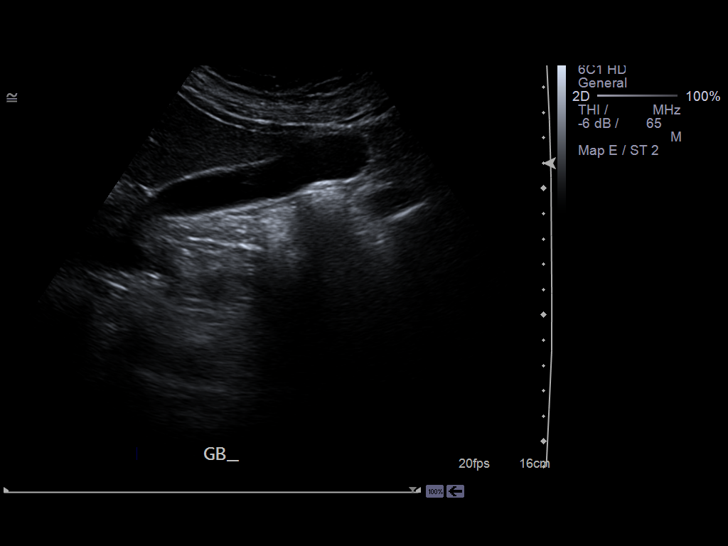
[im 29/29]
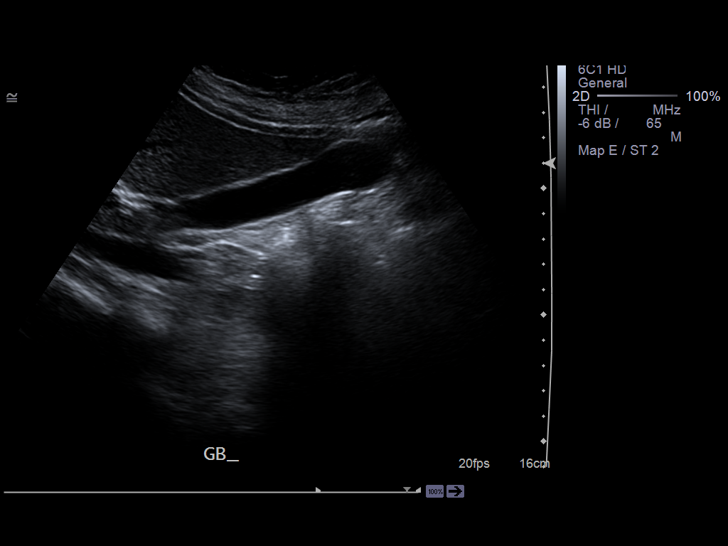

[14 of 25 positions shown; findings below may reference images not displayed]

PROCEDURE:     US  - US ABDOMEN LIMITED SURVEY  - April 03, 2012  [DATE]

RESULT:     A limited right upper quadrant ultrasound was performed in this
patient reportedly approximately 20 weeks pregnant.

The gallbladder is adequately distended with no evidence of stones, wall
thickening, or pericholecystic fluid. There is no positive sonographic
Murphy's sign. The observed portions of the liver appear normal. Portal
venous flow is normal in direction toward the liver. The common bile duct is
normal at 4.1 mm in diameter. The pancreatic head and proximal body appear
normal where visualized.
IMPRESSION: Normal right upper quadrant ultrasound examination.

[REDACTED]

## 2014-05-12 ENCOUNTER — Emergency Department: Payer: Self-pay | Admitting: Internal Medicine

## 2014-05-14 ENCOUNTER — Emergency Department: Payer: Self-pay | Admitting: Student

## 2014-05-19 DIAGNOSIS — Z8619 Personal history of other infectious and parasitic diseases: Secondary | ICD-10-CM

## 2014-05-19 HISTORY — DX: Personal history of other infectious and parasitic diseases: Z86.19

## 2014-08-04 IMAGING — US US OB < 14 WEEKS - US OB TV
1 series · 14 of 28 positions shown · non-contrast
Comparison: none

REASON FOR EXAM: pregnant, abdominal pain
COMMENTS:   May transport without cardiac monitor

[Series 1: us ob < 14 weeks - us ob tv · 0.25mm/px · 14 of 77 slices shown]
[im 3/77]
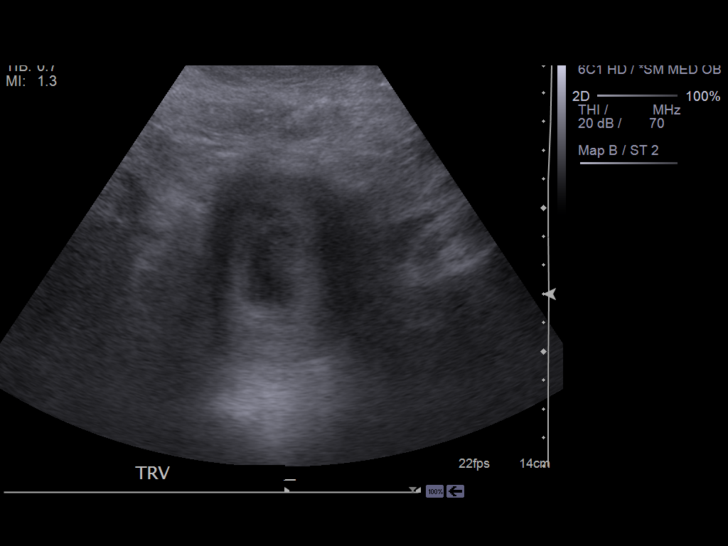
[im 9/77]
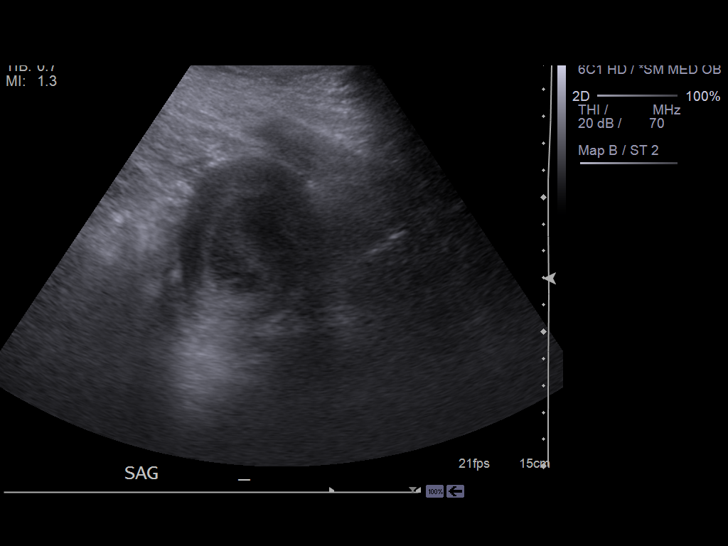
[im 15/77]
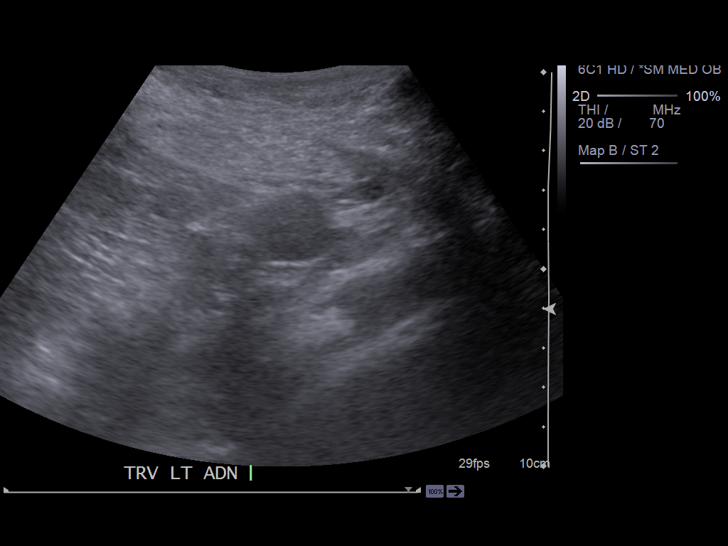
[im 20/77]
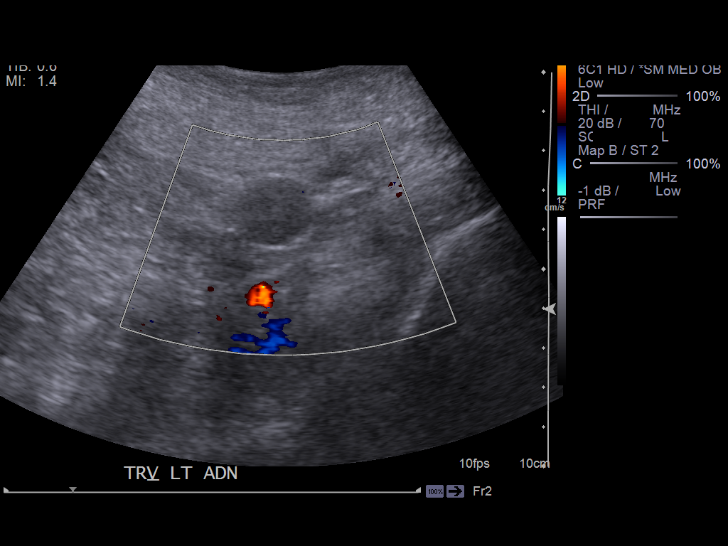
[im 26/77]
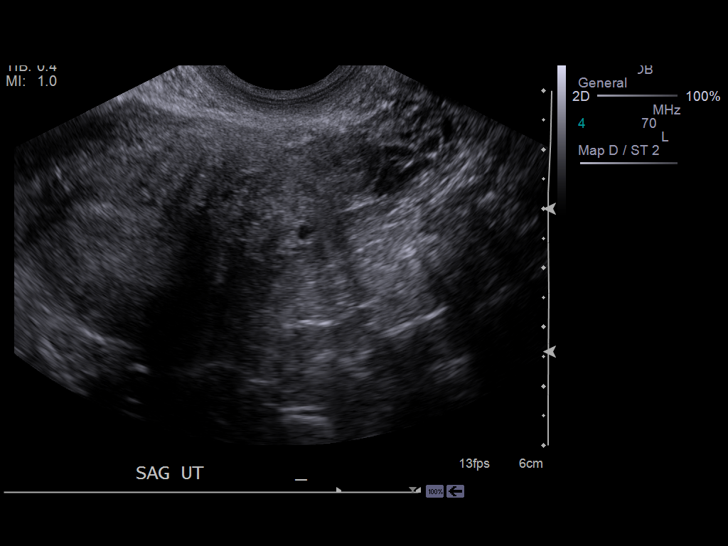
[im 31/77]
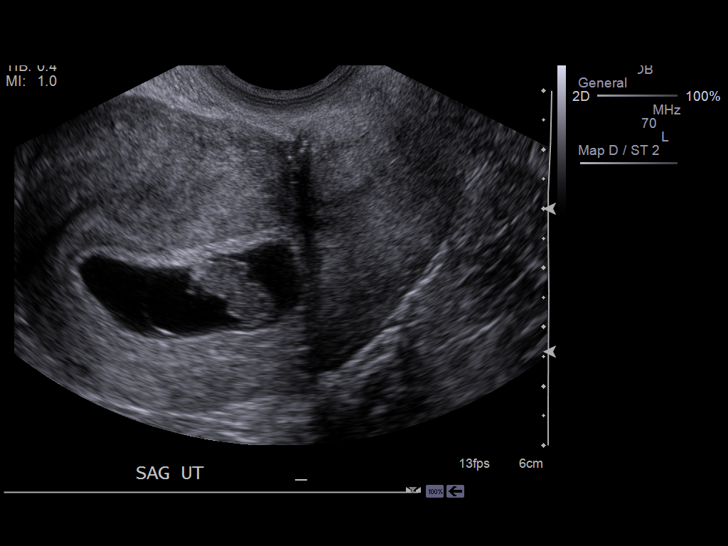
[im 37/77]
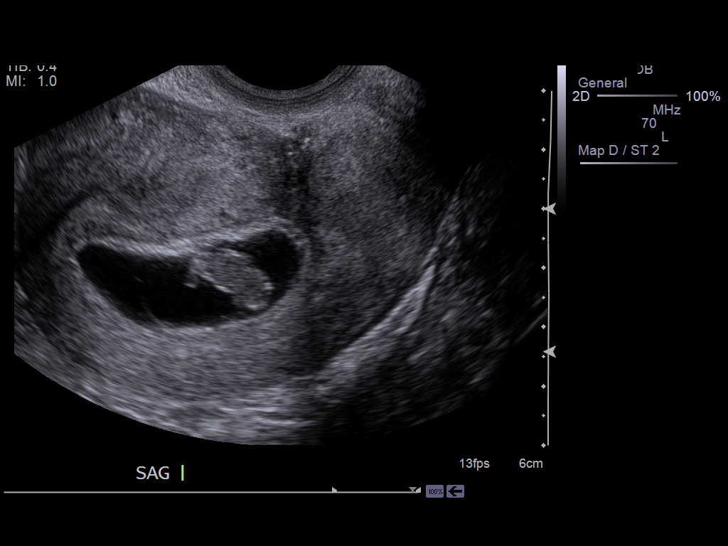
[im 43/77]
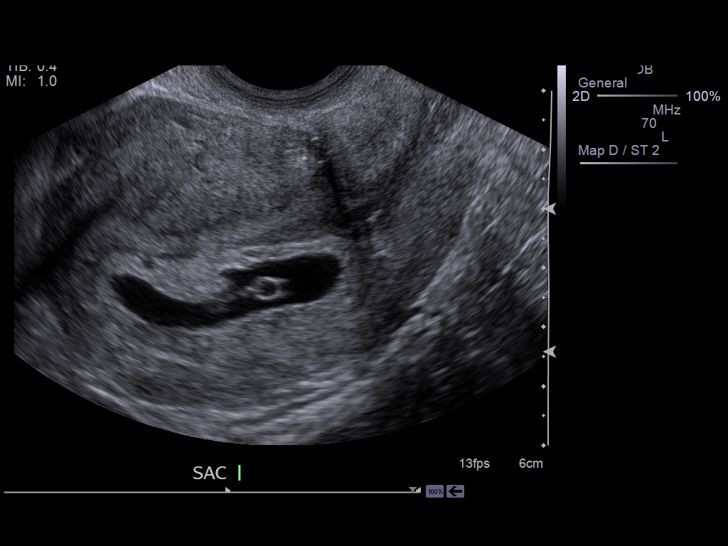
[im 48/77]
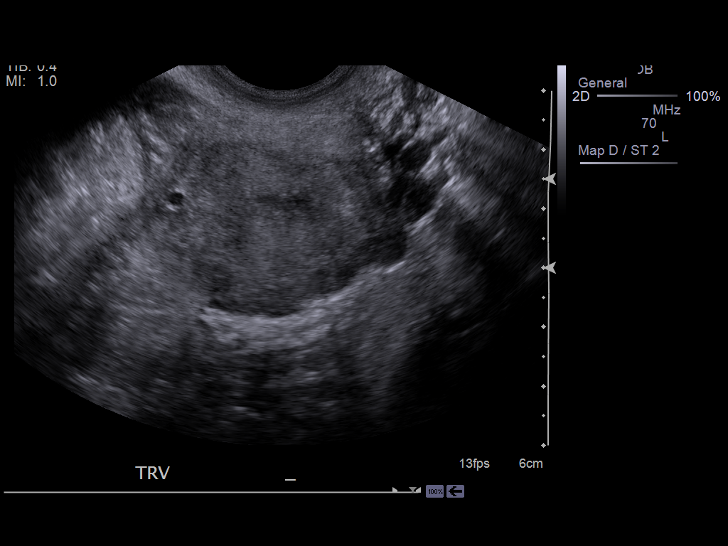
[im 54/77]
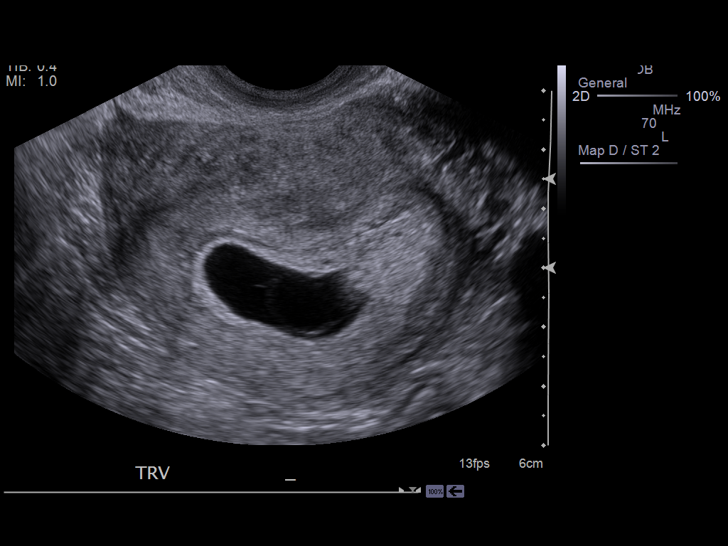
[im 60/77]
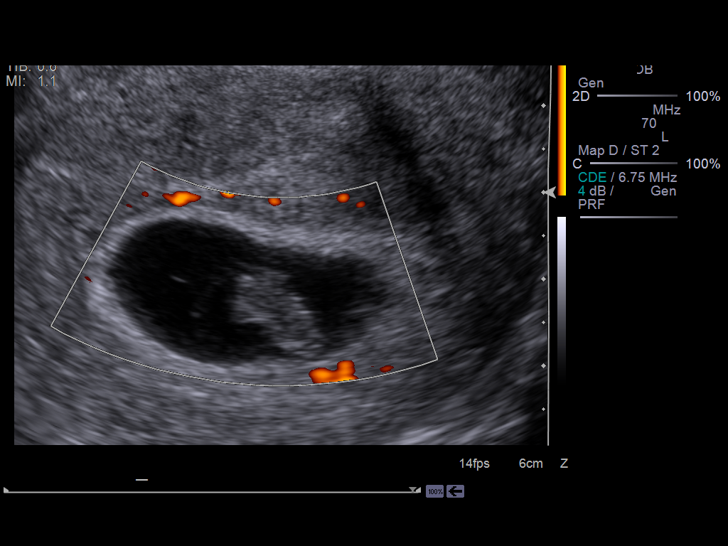
[im 65/77]
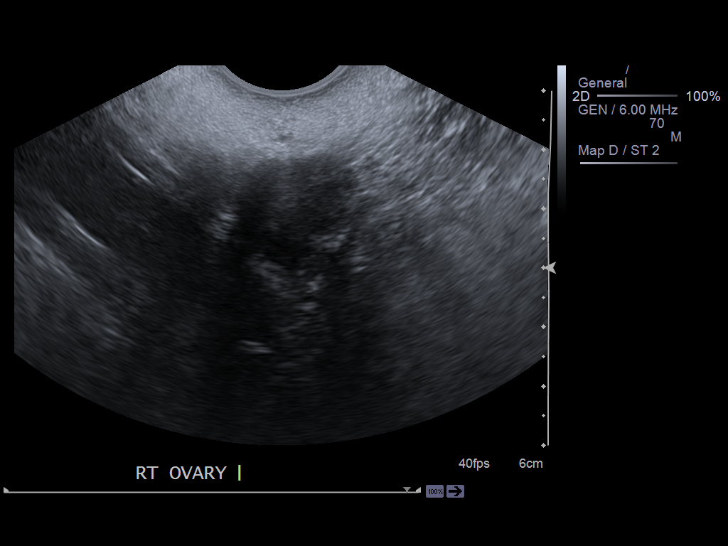
[im 71/77]
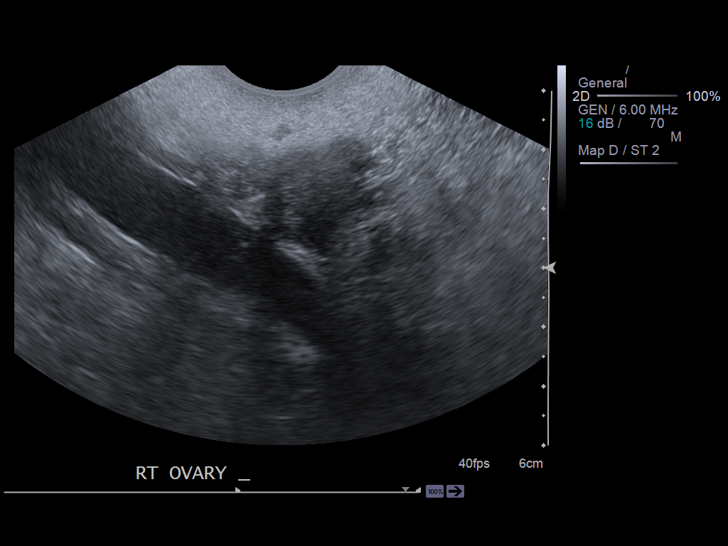
[im 77/77]
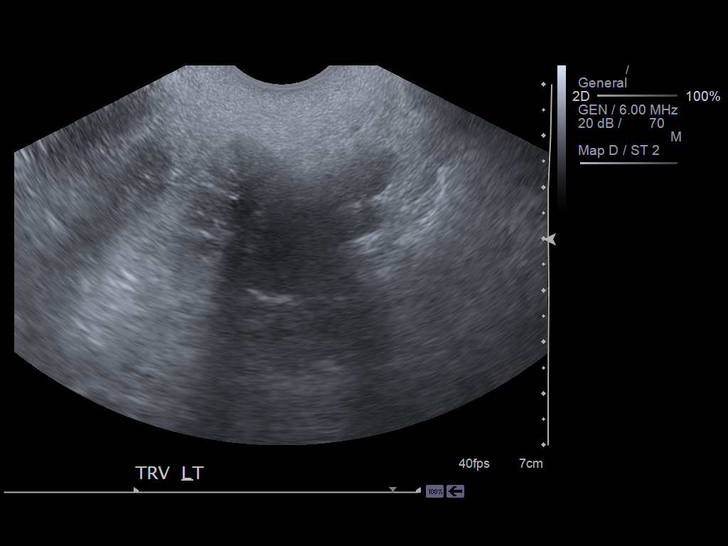

[14 of 28 positions shown; findings below may reference images not displayed]

PROCEDURE:     US  - US OB LESS THAN 14 WEEKS/W TRANS  - March 19, 2013  [DATE]

RESULT:     Transabdominal and transvaginal imaging was performed through
the pelvis. The patient was unable to tolerate the complete endovaginal
portion of the study.

There is a gravid uterus. The crown-rump length of the fetus measures
cm corresponding to a 7 week 6 day gestation. However, no fetal cardiac
activity is demonstrated. The right ovary demonstrates normal echotexture
and measures 2.2 x 1.7 x 1.4 cm. The left ovary could not be adequately
evaluated. The left adnexal region also was poorly evaluated due to the
patient's inability to tolerate the complete endovaginal exam.
IMPRESSION: 1. The findings are consistent with fetal demise. No fetal cardiac flicker
is demonstrated. The gestational age of the fetus would be approximately 7
weeks 6 days.
2. The right ovary is normal in appearance. The left ovary and adnexal
structures cannot be adequately assessed due to the patient's inability to
tolerate the study.

[REDACTED]

## 2014-10-30 NOTE — Op Note (Signed)
PATIENT NAME:  Jordan PereyraDELOSH, Tamula MR#:  [id number] DATE OF BIRTH:  [birth year]  DATE OF PROCEDURE:  07/15/2012  PREOPERATIVE DIGNOSES:  1.  Severe intrauterine growth retardation, 31 + 2 weeks estimated gestational age. Fetal growth consistent with a 25 + 1 weeks.  2.  Oligohydramnios.  3.  Nonreassuring fetal monitoring.   POSTOPERATIVE DIAGNOSIS:  1.  Severe intrauterine growth retardation, 31 + 2 weeks estimated gestational age. Fetal growth consistent with a 25 + 1 weeks.  2.  Oligohydramnios.  3.  Nonreassuring fetal monitoring.   PROCEDURE: Primary low transverse cesarean section.   ANESTHESIA: Spinal followed by general endotracheal anesthesia.   SURGEON: Suzy Bouchardhomas J Schermerhorn, M.D.   FIRST ASSISTANT:  <<Orr>>, scrub tech.   INDICATION: This is a 29 year old gravida 3, para 1 patient at 7331 + 2 weeks estimated gestational age, admitted to labor and delivery with severe nausea and vomiting. The fetus demonstrated several spontaneous decelerations, which prompted an ultrasound that demonstrated oligohydramnios with no fluid noted. Formal ultrasound in radiology demonstrated severe IUGR with 6 weeks of fetal growth lag, possible cardiomegaly and pericardial effusion.   PROCEDURE: After adequate spinal anesthesia, which was converted to general endotracheal anesthesia, the patient was previously prepped and draped in a sterile fashion. She did receive clindamycin 900 mg IV and 80 mg of IV gentamicin for prophylaxis. A Pfannenstiel incision was made upon 2 fingerbreadths above the symphysis pubis. Sharp dissection was used to identify the fascia. The fascia was opened in the midline and opened in a transverse fashion. The superior aspect of the fascia was grasped with Kocher clamps. Pyramidalis muscle and recti muscles were dissected free. Entry into the peritoneal cavity was accomplished sharply. Bladder blade was placed into the abdomen and an adequately developed lower uterine segment was  identified. A transverse low uterine incision was made. Upon entry into the endometrial cavity, clear fluid resulted. The incision was extended with blunt transverse traction. A small fetal head was brought to the incision and infant female was delivered. Spontaneous cry on the abdomen while the cord was being clamped and cut. Dr. Abundio MiuHorowitz, neonatologist assumed care of the infant. He assigned Apgar scores of 7 and 9. Cord gas obtained and placenta was manually delivered and the uterus was exteriorized and wiped clean with laparotomy tape. The cervix was opened with a ring forceps and this was passed off the operative field. The uterine incision was closed with 1 chromic suture in a running locking fashion with good approximation of edges. Good hemostasis was noted. Fallopian tubes and ovaries appeared normal. The posterior cul-de-sac was irrigated and suctioned. The uterus was placed back into the abdominal cavity and the paracolic gutters were wiped clean with laparotomy tape.  The uterine incision again appeared hemostatic. Interceed was placed over the uterine incision in a T-shaped fashion. The superior aspect of the fascia was grasped with Kocher clamps and the On-Q pump catheters were placed infraumbilically and tunneled subfascially. The fascia was then closed over top of these with 0 Vicryl suture in a running nonlocking fashion with good approximation of edges. Good hemostasis noted. Subcutaneous tissues were irrigated and bovied for hemostasis and the skin was reapproximated with staples. The On-Q pump catheters were then secured at the skin level with Dermabond and Steri-Strips applied to keep the catheters in place and a Tegaderm placed above this. Both catheters were loaded with 5 mL of 0.5% Marcaine. There were no complications. Estimated blood loss 600 mL. Intraoperative fluids 300 mL. The patient tolerated the  procedure well and was taken to the recovery room in good  condition.  ____________________________ Suzy Bouchard, MD tjs:aw D: 07/15/2012 01:22:00 ET T: 07/15/2012 08:28:01 ET JOB#: [id number]  cc: Suzy Bouchard, MD, <Dictator>  Suzy Bouchard MD ELECTRONICALLY SIGNED 07/25/2012 10:53

## 2014-10-30 NOTE — Op Note (Signed)
PATIENT NAME:  Jordan Callahan, Jordan Callahan MR#:  [id number] DATE OF BIRTH:  [birth year]  DATE OF PROCEDURE:  07/14/2012  PREOPERATIVE DIGNOSES:  1.  Severe intrauterine growth retardation, 31 + 2 weeks estimated gestational age. Fetal growth consistent with a 25 + 1 weeks.  2.  Oligohydramnios.  3.  Nonreassuring fetal monitoring.   POSTOPERATIVE DIAGNOSIS:  1.  Severe intrauterine growth retardation, 31 + 2 weeks estimated gestational age. Fetal growth consistent with a 25 + 1 weeks.  2.  Oligohydramnios.  3.  Nonreassuring fetal monitoring.   PROCEDURE: Primary low transverse cesarean section.   ANESTHESIA: Spinal followed by general endotracheal anesthesia.   SURGEON: Suzy Bouchardhomas J Schermerhorn, M.D.   FIRST ASSISTANT: (Dictation Anomaly) <<MISSING TEXT>>, scrub tech.   INDICATION: This is a 29 year old gravida 3, para 1 patient at 2331 + 2 weeks estimated gestational age, admitted to labor and delivery with severe nausea and vomiting. The fetus demonstrated several spontaneous decelerations, which prompted an ultrasound that demonstrated oligohydramnios with no fluid noted. Formal ultrasound in radiology demonstrated severe IUGR with 6 weeks of fetal growth lag, possible cardiomegaly and pericardial effusion.   PROCEDURE: After adequate spinal anesthesia, which was converted to general endotracheal anesthesia, the patient was previously prepped and draped in a sterile fashion. She did receive clindamycin 900 mg IV and 80 mg of IV gentamicin for prophylaxis. A Pfannenstiel incision was made upon 2 fingerbreadths above the symphysis pubis. Sharp dissection was used to identify the fascia. The fascia was opened in the midline and opened in a transverse fashion. The superior aspect of the fascia was grasped with Kocher clamps. Pyramidalis muscle and recti muscles were dissected free. Entry into the peritoneal cavity was accomplished sharply. Bladder blade was placed into the abdomen and an adequately developed  lower uterine segment was identified. A transverse low uterine incision was made. Upon entry into the endometrial cavity, clear fluid resulted. The incision was extended with blunt transverse traction. A small fetal head was brought to the incision and infant female was delivered. Spontaneous cry on the abdomen while the cord was being clamped and cut. Dr. Abundio MiuHorowitz, neonatologist assumed care of the infant. He assigned Apgar scores of 7 and 9. Cord gas obtained and placenta was manually delivered and the uterus was exteriorized and wiped clean with laparotomy tape. The cervix was opened with a ring forceps and this was passed off the operative field. The uterine incision was closed with 1 chromic suture in a running locking fashion with good approximation of edges. Good hemostasis was noted. Fallopian tubes and ovaries appeared normal. The posterior cul-de-sac was irrigated and suctioned. The uterus was placed back into the abdominal cavity and the paracolic gutters were wiped clean with laparotomy tape.  The uterine incision again appeared hemostatic. Interceed was placed over the uterine incision in a T-shaped fashion. The superior aspect of the fascia was grasped with Kocher clamps and the On-Q pump catheters were placed infraumbilically and tunneled subfascially. The fascia was then closed over top of these with 0 Vicryl suture in a running nonlocking fashion with good approximation of edges. Good hemostasis noted. Subcutaneous tissues were irrigated and bovied for hemostasis and the skin was reapproximated with staples. The On-Q pump catheters were then secured at the skin level with Dermabond and Steri-Strips applied to keep the catheters in place and a Tegaderm placed above this. Both catheters were loaded with 5 mL of 0.5% Marcaine. There were no complications. Estimated blood loss 600 mL. Intraoperative fluids 300 mL. The patient  tolerated the procedure well and was taken to the recovery room in good  condition.  ____________________________ Suzy Bouchard, MD tjs:aw D: 07/15/2012 01:22:23 ET T: 07/15/2012 08:28:01 ET JOB#: [id number]  cc: Suzy Bouchard, MD, <Dictator>

## 2014-10-30 NOTE — Consult Note (Signed)
PATIENT NAME:  Jordan PereyraDELOSH, Averiana MR#:  [id number] DATE OF BIRTH:  [birth year]  DATE OF CONSULTATION:  07/15/2012  REFERRING PHYSICIAN:  Suzy Bouchardhomas J. Schermerhorn, MD CONSULTING PHYSICIAN:  Jaxie Racanelli B. Nazaiah Navarrete, MD  REASON FOR CONSULTATION: To evaluate a patient with anger control problems and history of bipolar.   IDENTIFYING DATA: The patient is a 29 year old female with history of bipolar illness and multiple personality disorder.   CHIEF COMPLAINT: "I'm okay."   HISTORY OF PRESENT ILLNESS: The patient was brought to the hospital unexpectedly and delivered a 31-week preemie by cesarean section. [facility] The patient wanted to be transferred as well, but apparently this was impossible. She became rather upset about it, feeling that we are not doing our best to reunite her with her baby. She is naturally worried about the child and wants to be close to her baby. She reports a long history of diagnosis of bipolar disorder but has not been treated for it for the past two years. In part, this is due to pregnancy, but prior to that, she lost her health insurance and was no longer able to afford psychiatric care. She feels, however, that she has done well throughout her pregnancy and before. She works at a [employer] in [employer], has been employed there for 4 years, feels that she would not be able to handle her difficult and stressful job had she not been stable enough. She has a long history of psychotherapy and apparently learned some good coping skills that she is able to utilize successfully. She denies any symptoms of depression, anxiety or psychosis. She denies symptoms suggestive of bipolar mania. She denies alcohol or illicit drug or prescription pill abuse.   PAST PSYCHIATRIC HISTORY: She was diagnosed early on. She has been treated with multiple medications including lithium but is unwilling to take lithium now. In fact, she is unwilling to take any medications  presently. She had been in psychotherapy for many years and finds it very useful. In addition to diagnosis of bipolar disorder, she also was given a diagnosis of multiple personality disorder. There is possibly one suicide attempt when younger. She also used to be a cutter but has not been cutting in 5 or 6 years.   FAMILY PSYCHIATRIC HISTORY: Multiple family members with bipolar and possibly schizophrenia.   PAST MEDICAL HISTORY: None.   ALLERGIES: PENICILLIN.  MEDICATIONS ON ADMISSION: Prenatal multivitamins, naproxen 500 mg twice daily, ferrous sulfate 325 mg daily, Colace 100 mg daily, acetaminophen as needed.   SOCIAL HISTORY: She lives with her boyfriend who is very supportive. She also has a supportive mother. She works at the [employer]. She would like to breast feed her baby. She would like to stay with the baby as long as possible, but she realizes that she will have to return to work rather soon. Her mother and her sister, as well as her fianc will help her taking care of the baby as they all work different shifts.   REVIEW OF SYSTEMS: CONSTITUTIONAL: No fevers or chills. Positive for weight gain during pregnancy.  EYES: No double or blurred vision.  ENT: No hearing loss.  RESPIRATORY: No shortness of breath or cough.  CARDIOVASCULAR: No chest pain or orthopnea.  GASTROINTESTINAL: No abdominal pain, nausea, vomiting or diarrhea.  GENITOURINARY: No incontinence or frequency.  ENDOCRINE: No heat or cold intolerance.  LYMPHATIC: No anemia or easy bruising.  INTEGUMENTARY: No acne or rash.  MUSCULOSKELETAL: No muscle or joint  pain.  NEUROLOGIC: No tingling or weakness.  PSYCHIATRIC: See history of present illness for details.   PHYSICAL EXAMINATION:  VITAL SIGNS: Blood pressure 104/66, pulse 76, respirations 18, temperature 97.8.  GENERAL: This is a well-developed young female in no acute distress.   The rest of the physical examination is deferred to her primary  attending.   LABORATORY DATA: Chemistries are within normal limits except for blood glucose of 103. LFTs within normal limits except for AST of 41. Urine tox screen positive for cannabinoids. CBC with mild anemia following cesarean section. Urinalysis is not suggestive of urinary tract infection.   MENTAL STATUS EXAMINATION: The patient is alert and oriented to person, place, time, and situation. She is pleasant, polite and cooperative. She is well spoken and rather engaging in our conversation. She is in bed wearing a hospital gown. She maintains good eye contact. Her speech is of normal rhythm, rate and volume. Mood is fine with full affect. Thought processing is logical and goal oriented. Thought content: She denies suicidal or homicidal ideations. There are no delusions or paranoia. There are no auditory or visual hallucinations. Her cognition is grossly intact. She registers 3/3 and recalls 3/3 objects after five minutes. She can spell "world" forwards and backwards. She knows the current president. Her insight and judgment are good. Towards the end of our interview, her fianc walks into the room. They have a very appropriate and loving interaction.   SUICIDE RISK ASSESSMENT: This is a patient with a long history of mood instability who has been off medication due to pregnancy who has been coping with her illness very well.   DIAGNOSES:  AXIS I: Bipolar affective disorder per history, marijuana abuse.  AXIS II: Deferred.  AXIS III: Status post cesarean section.  AXIS IV: Mental illness, stress of delivering a preemie baby.  AXIS V: GAF 45.   PLAN: The patient is reluctant to take medications for two reasons. First, she wants to breast feed her baby if possible. Secondly, she already made an appointment with [facility] that is scheduled for Thursday, January 9th. She hopes to keep this appointment or reschedule. She would prefer to negotiate her medications with her future  provider. She prefers psychotherapy to medication, so no medications are recommended at this time. There are no safety concerns. Please discharge as appropriate.     ____________________________ Ellin Goodie. Zell Hylton, MD jbp:es D: 07/16/2012 14:09:00 ET T: 07/16/2012 14:54:09 ET JOB#: [id number]  cc: Kimberlyann Hollar B. Jennet Maduro, MD, <Dictator> Shari Prows MD ELECTRONICALLY SIGNED 07/17/2012 6:54

## 2014-10-30 NOTE — Consult Note (Signed)
Brief Consult Note: Diagnosis: Buipolar afective disorder by history.   Patient was seen by consultant.   Consult note dictated.   Recommend further assessment or treatment.   Comments: [name] has a h/o bipolar disorder off medications for 2 years doing well. She delivered a baby at 31 weeks. he is naturally worried and [facility] be [facility] and feels that we do not try hard enough to make it happen. [facility] of bipolar on Thursday. She would prefer therapy over medications. it worked in the past and she wants to breastfeed.   PLAN: 1. No medications recommended at this time.   2. There are no safety concerns. Please discharge as appropriate.  Electronic Signatures: Kristine LineaPucilowska, Maranatha Grossi (MD)  (Signed 06-Jan-14 19:02)  Authored: Brief Consult Note   Last Updated: 06-Jan-14 19:02 by Kristine LineaPucilowska, Barbarita Hutmacher (MD)

## 2014-10-30 NOTE — Op Note (Signed)
PATIENT NAME:  Jordan Callahan, Jordan Callahan MR#:  [id number] DATE OF BIRTH:  [birth year]  DATE OF PROCEDURE:  03/19/2013  PREOPERATIVE DIAGNOSIS:  Missed abortion.   POSTOPERATIVE DIAGNOSIS:  Missed abortion.   PROCEDURE:  Suction dilation and curettage.   SURGEON:  Jennell Cornerhomas Sharie Amorin, M.D.   ANESTHESIA:  General endotracheal anesthesia.   INDICATIONS:  This is a 29 year old gravida 5, para 1 patient with unsure last menstrual period presents to the Emergency Department the day of the procedure with abdominal and pelvic cramping.  Ultrasound performed demonstrated intrauterine pregnancy measuring 7 weeks and 3 days, negative fetal heart motion.   DESCRIPTION OF PROCEDURE:  After adequate general endotracheal anesthesia, the patient was placed in the dorsal supine position with legs in the candy cane stirrups.  Lower abdomen, perineum and vagina were prepped with Betadine followed by straight catheterization of the bladder yielding 50 mL urine.  Weighted speculum was placed in the posterior vaginal vault and the anterior cervix was grasped with a single-tooth tenaculum.  Cervix was then dilated to #20 Hanks dilator without difficulty.  A #8 flexible suction curette placed into the endometrial cavity and suction curettage performed with tissue consistent with products of conception.  Sharp curettage performed with no additional tissue and a repeat suction curettage with no additional tissue.  Good hemostasis noted.  There were no complications.  Estimated blood loss minimal.   INTRAOPERATIVE FLUIDS:  400 mL.    The patient was taken to the recovery room in good condition.    ____________________________ Suzy Bouchardhomas J. Patrice Moates, MD tjs:ea D: 03/19/2013 23:27:52 ET T: 03/20/2013 00:20:40 ET JOB#: [id number]  cc: Suzy Bouchardhomas J. Kaige Whistler, MD, <Dictator> Suzy BouchardHOMAS J Katheryn Culliton MD ELECTRONICALLY SIGNED 03/23/2013 9:48

## 2014-10-30 NOTE — Discharge Summary (Signed)
PATIENT NAME:  Jordan Callahan, Jordan Callahan MR#:  [id number] DATE OF BIRTH:  26-Jan-1986  DATE OF ADMISSION:  07/14/2012 DATE OF DISCHARGE:  07/16/2012  HOSPITAL COURSE: The patient was admitted to labor and delivery. The fetus demonstrated to have severe growth restriction. The patient underwent a primary low transverse cesarean section, yielded a female, weight of 660 grams. [facility] and [facility]. From the patient's standpoint postoperatively, she did well. Postoperative day #1 hematocrit of 31.3%. The patient did undergo a psychiatric evaluation on postoperative day #1, given her history of bipolar disease. The patient was cleared to be discharged as deemed by her obstetricians. On the patient's postoperative day #2, vital signs were stable and exam was within normal limits. She was discharged to home early given her infant is at another facility. The patient will [facility]. She will leave with the On-Q pump, and she will be given instructions how to remove this.   DISCHARGE MEDICATIONS: Norco 5/325, 1 to 2 tablets every 4 hours as needed for pain, Naprosyn 500 mg twice a day as needed for pain.   FOLLOWUP: The patient will follow up, again, with Dr. Feliberto GottronSchermerhorn or before if she has wound drainage, fever, nausea, or vomiting.   The patient also received Rhophylac postpartum, will get varicella, TDAP vaccinations as well, and also received rubella vaccination.   ____________________________ Suzy Bouchardhomas J. Sipriano Fendley, MD tjs:cb D: 07/16/2012 09:52:47 ET T: 07/16/2012 13:41:54 ET JOB#: [id number]  cc: Suzy Bouchardhomas J. Sitlali Koerner, MD, <Dictator> Suzy BouchardHOMAS J Georgian Mcclory MD ELECTRONICALLY SIGNED 07/25/2012 10:51

## 2014-11-17 NOTE — H&P (Signed)
L&D Evaluation:  History:   HPI 29 yo G3P0110 at 31+3 weeks receiving poor PNC at [facility]---> [facility] with 24 hour h/o emesis ( 20 X)  and diarrhea. Pt spent all day in ED received multiple doses of antiemetics. No hematemesis no hematochezia. Pt [facility] , but given multiple concerns and failure to make appts with MFM and Mood disorder [facility]. PT has only had 1 visit there in last 3 months. Pt states she had pancreatitis and c. diff 18 months ago .([facility] - [facility]) U/s today neg for gallstones or sludging. patient BP mildly elevated in ED and here at L+D. 2+ protein dip with nl uric acid. Pt ddenies scotomata. 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  HEENT, CNS, GI, GU, Respiratory, CV, Renal and Musculoskeletal systems were found to be normal., except as stated above   Exam:   Vital Signs BP >140/90    Urine Protein 2+    General no apparent distress    Mental Status clear    Chest clear    Heart normal sinus rhythm    Abdomen 1+ TTP upper abd ttp and lower abd    Fetal Position vtx    Back no CVAT    Reflexes 1+    Pelvic closed / long / oop vtx    Mebranes Intact    FHT intermittent decel 1' with return to baseline 150's    Ucx absent   Impression:   Impression INtractable emesis, abdominal pain, diarrhea, suspicious fetal monitoring. Mildly elevated BP possible preeclampsia   Plan:   Plan admit for continous moniotoring. Start 24 hour urine collection, Betamethasone for fetal lung maturity, rhophylac given she missed her 28 week shot, drug screen,   antiemetics with regan 10 q 8 hrs and zofran 4 mg iv q 6 hours. Iv fluids D5 lr  with  20 meq KCL. If N/V does not improve will get GI consult. Stool work/up for diarrhea. Repeat PIH panel in am   Electronic Signatures: Jordan Callahan, Jordan Callahan (MD)  (Signed 05-Jan-14 19:56)  Authored: L&D Evaluation   Last Updated: 05-Jan-14 19:56 by Suzy BouchardSchermerhorn, Cierrah Dace Callahan (MD)

## 2014-11-22 DIAGNOSIS — J309 Allergic rhinitis, unspecified: Secondary | ICD-10-CM | POA: Insufficient documentation

## 2014-11-22 DIAGNOSIS — F17210 Nicotine dependence, cigarettes, uncomplicated: Secondary | ICD-10-CM | POA: Insufficient documentation

## 2014-11-22 DIAGNOSIS — J4541 Moderate persistent asthma with (acute) exacerbation: Secondary | ICD-10-CM

## 2014-11-22 HISTORY — DX: Moderate persistent asthma with (acute) exacerbation: J45.41

## 2015-01-04 DIAGNOSIS — R768 Other specified abnormal immunological findings in serum: Secondary | ICD-10-CM

## 2015-01-04 DIAGNOSIS — A749 Chlamydial infection, unspecified: Secondary | ICD-10-CM

## 2015-01-04 DIAGNOSIS — R7689 Other specified abnormal immunological findings in serum: Secondary | ICD-10-CM

## 2015-01-04 HISTORY — DX: Other specified abnormal immunological findings in serum: R76.89

## 2015-01-04 HISTORY — DX: Other specified abnormal immunological findings in serum: R76.8

## 2015-01-04 HISTORY — DX: Chlamydial infection, unspecified: A74.9

## 2015-01-07 ENCOUNTER — Emergency Department
Admission: EM | Admit: 2015-01-07 | Discharge: 2015-01-07 | Disposition: A | Discharge status: Home / Self Care | Payer: Medicaid Other | Attending: Emergency Medicine | Admitting: Emergency Medicine

## 2015-01-07 ENCOUNTER — Encounter: Payer: Self-pay | Admitting: *Deleted

## 2015-01-07 DIAGNOSIS — Z720 Tobacco use: Secondary | ICD-10-CM | POA: Insufficient documentation

## 2015-01-07 DIAGNOSIS — R110 Nausea: Secondary | ICD-10-CM | POA: Insufficient documentation

## 2015-01-07 DIAGNOSIS — Z3202 Encounter for pregnancy test, result negative: Secondary | ICD-10-CM | POA: Insufficient documentation

## 2015-01-07 DIAGNOSIS — R197 Diarrhea, unspecified: Secondary | ICD-10-CM | POA: Insufficient documentation

## 2015-01-07 DIAGNOSIS — R109 Unspecified abdominal pain: Secondary | ICD-10-CM | POA: Diagnosis present

## 2015-01-07 DIAGNOSIS — R1084 Generalized abdominal pain: Secondary | ICD-10-CM

## 2015-01-07 DIAGNOSIS — Z880 Allergy status to penicillin: Secondary | ICD-10-CM | POA: Diagnosis not present

## 2015-01-07 HISTORY — DX: Unspecified asthma, uncomplicated: J45.909

## 2015-01-07 LAB — COMPREHENSIVE METABOLIC PANEL
ALBUMIN: 3.5 g/dL (ref 3.5–5.0)
ALT: 16 U/L (ref 14–54)
AST: 17 U/L (ref 15–41)
Alkaline Phosphatase: 92 U/L (ref 38–126)
Anion gap: 7 (ref 5–15)
BILIRUBIN TOTAL: 0.7 mg/dL (ref 0.3–1.2)
BUN: 7 mg/dL (ref 6–20)
CALCIUM: 8.4 mg/dL — AB (ref 8.9–10.3)
CHLORIDE: 106 mmol/L (ref 101–111)
CO2: 22 mmol/L (ref 22–32)
CREATININE: 0.65 mg/dL (ref 0.44–1.00)
GFR calc Af Amer: >60 mL/min (ref >60)
Glucose, Bld: 87 mg/dL (ref 65–99)
Potassium: 3.9 mmol/L (ref 3.5–5.1)
Sodium: 135 mmol/L (ref 135–145)
Total Protein: 7.4 g/dL (ref 6.5–8.1)

## 2015-01-07 LAB — CBC WITH DIFFERENTIAL/PLATELET
BASOS ABS: 0.1 10*3/uL (ref 0–0.1)
Basophils Relative: 1 %
Eosinophils Absolute: 0.1 10*3/uL (ref 0–0.7)
Eosinophils Relative: 1 %
HEMATOCRIT: 39.3 % (ref 35.0–47.0)
Hemoglobin: 13.4 g/dL (ref 12.0–16.0)
LYMPHS PCT: 13 %
Lymphs Abs: 1.9 10*3/uL (ref 1.0–3.6)
MCH: 31.9 pg (ref 26.0–34.0)
MCHC: 34 g/dL (ref 32.0–36.0)
MCV: 93.8 fL (ref 80.0–100.0)
Monocytes Absolute: 0.9 10*3/uL (ref 0.2–0.9)
Monocytes Relative: 7 %
NEUTROS PCT: 78 %
Neutro Abs: 11 10*3/uL — ABNORMAL HIGH (ref 1.4–6.5)
Platelets: 287 10*3/uL (ref 150–440)
RBC: 4.19 MIL/uL (ref 3.80–5.20)
RDW: 13.4 % (ref 11.5–14.5)
WBC: 14.1 10*3/uL — ABNORMAL HIGH (ref 3.6–11.0)

## 2015-01-07 LAB — LIPASE, BLOOD: Lipase: 49 U/L (ref 22–51)

## 2015-01-07 MED ORDER — ONDANSETRON HCL 4 MG PO TABS: 4.0000 mg | ORAL_TABLET | Freq: Once | ORAL | Status: AC

## 2015-01-07 MED ORDER — DICYCLOMINE HCL 10 MG PO CAPS
20.0000 mg | ORAL_CAPSULE | Freq: Once | ORAL | Status: AC
  Administered 2015-01-07: 20 mg via ORAL
  Filled 2015-01-07: qty 2

## 2015-01-07 MED ORDER — ONDANSETRON 4 MG PO TBDP
ORAL_TABLET | ORAL | Status: AC
  Administered 2015-01-07: 4 mg
  Filled 2015-01-07: qty 1

## 2015-01-07 NOTE — ED Provider Notes (Signed)
[facility] Emergency Department Provider Note    ____________________________________________  Time seen: 1635  I have reviewed the triage vital signs and the nursing notes.   HISTORY  Chief Complaint Diarrhea   History limited by: Not Limited   HPI Jordan Callahan is a 29 y.o. female who presents to the emergency department today because of concerns for diarrhea and abdominal pain. Patient states that is been getting worse for the past couple of days. She had multiple episodes of diarrhea today. She states her abdomen has diffuse cramping. This comes in waves. She is not a semi-listening alleviating factors. She states she has had similar pain in the past when she was diagnosed with C. difficile. She states she has been on antibiotics recently for an STI as well as otitis.     Past Medical History  Diagnosis Date  . Asthma     There are no active problems to display for this patient.   History reviewed. No pertinent past surgical history.  No current outpatient prescriptions on file.  Allergies Penicillins  No family history on file.  Social History History  Substance Use Topics  . Smoking status: Current Some Day Smoker -- 0.50 packs/day  . Smokeless tobacco: Not on file  . Alcohol Use: No    Review of Systems  Constitutional: Negative for fever. Cardiovascular: Negative for chest pain. Respiratory: Negative for shortness of breath. Gastrointestinal: Positive for abdominal pain, diarrhea Genitourinary: Negative for dysuria. Musculoskeletal: Negative for back pain. Skin: Negative for rash. Neurological: Negative for headaches, focal weakness or numbness.   10-point ROS otherwise negative.  ____________________________________________   PHYSICAL EXAM:  VITAL SIGNS: ED Triage Vitals  Enc Vitals Group     BP 01/07/15 1530 115/84 mmHg     Pulse Rate 01/07/15 1530 83     Resp --      Temp 01/07/15 1530 98.3 F (36.8 C)      Temp Source 01/07/15 1530 Oral     SpO2 01/07/15 1530 97 %     Weight 01/07/15 1530 175 lb (79.379 kg)     Height 01/07/15 1530 5' (1.524 m)     Head Cir --      Peak Flow --      Pain Score 01/07/15 1531 9     Pain Loc --      Pain Edu? --      Excl. in GC? --      Constitutional: Alert and oriented. Well appearing and in no distress. Eyes: Conjunctivae are normal. PERRL. Normal extraocular movements. ENT   Head: Normocephalic and atraumatic.   Nose: No congestion/rhinnorhea.   Mouth/Throat: Mucous membranes are moist.   Neck: No stridor. Hematological/Lymphatic/Immunilogical: No cervical lymphadenopathy. Cardiovascular: Normal rate, regular rhythm.  No murmurs, rubs, or gallops. Respiratory: Normal respiratory effort without tachypnea nor retractions. Breath sounds are clear and equal bilaterally. No wheezes/rales/rhonchi. Gastrointestinal: Soft and minimally diffusely tender. No guarding. No rebound. Genitourinary: Deferred Musculoskeletal: Normal range of motion in all extremities. No joint effusions.  No lower extremity tenderness nor edema. Neurologic:  Normal speech and language. No gross focal neurologic deficits are appreciated. Speech is normal.  Skin:  Skin is warm, dry and intact. No rash noted. Psychiatric: Mood and affect are normal. Speech and behavior are normal. Patient exhibits appropriate insight and judgment.  ____________________________________________    LABS (pertinent positives/negatives)  Labs Reviewed  CBC WITH DIFFERENTIAL/PLATELET - Abnormal; Notable for the following:    WBC 14.1 (*)  Neutro Abs 11.0 (*)    All other components within normal limits  COMPREHENSIVE METABOLIC PANEL - Abnormal; Notable for the following:    Calcium 8.4 (*)    All other components within normal limits  C DIFFICILE QUICK SCAN W PCR REFLEX ([facility] ONLY)  LIPASE, BLOOD  POC URINE PREG, ED      ____________________________________________   EKG  None  ____________________________________________    RADIOLOGY  None  ____________________________________________   PROCEDURES  Procedure(s) performed: None  Critical Care performed: No  ____________________________________________   INITIAL IMPRESSION / ASSESSMENT AND PLAN / ED COURSE  Pertinent labs & imaging results that were available during my care of the patient were reviewed by me and considered in my medical decision making (see chart for details).  Patient here with concerns for abdominal cramping and diarrhea. Patient states she has a history of C. Difficile. Will check blood work, urine and send stool for c. Dif.   Patient was not able to provide a stool sample for C. difficile during her time here. Patient did become unfortunately upset at lack of pain control. I had a long discussion with patient stating that this point that we could try Toradol if the Bentyl did not provide relief. Patient stated she did not want any ibuprofen derivatives or Tylenol derivatives. I did have some hesitancy to give her opiate medication at this point given that we are awaiting a stool sample and I did not want to further delay stool sample collection. I discussed this with the patient. Patient continued to have generalized abdominal pain did not localize to any particular area. Patient stated she would simply like her discharge paperwork prior to providing a stool sample. I discussed with patient that she can obtain a stool sample home provided to the primary care doctor for C. difficile testing.  ____________________________________________   FINAL CLINICAL IMPRESSION(S) / ED DIAGNOSES  Final diagnoses:  Generalized abdominal pain  Nausea     Phineas SemenGraydon Onnie Alatorre, MD 01/07/15 [id number]

## 2015-01-07 NOTE — Discharge Instructions (Signed)
Please seek medical attention for any high fevers, chest pain, shortness of breath, change in behavior, persistent vomiting, bloody stool or any other new or concerning symptoms. ° °Abdominal Pain, Women °Abdominal (stomach, pelvic, or belly) pain can be caused by many things. It is important to tell your doctor: °· The location of the pain. °· Does it come and go or is it present all the time? °· Are there things that start the pain (eating certain foods, exercise)? °· Are there other symptoms associated with the pain (fever, nausea, vomiting, diarrhea)? °All of this is helpful to know when trying to find the cause of the pain. °CAUSES  °· Stomach: virus or bacteria infection, or ulcer. °· Intestine: appendicitis (inflamed appendix), regional ileitis (Crohn's disease), ulcerative colitis (inflamed colon), irritable bowel syndrome, diverticulitis (inflamed diverticulum of the colon), or cancer of the stomach or intestine. °· Gallbladder disease or stones in the gallbladder. °· Kidney disease, kidney stones, or infection. °· Pancreas infection or cancer. °· Fibromyalgia (pain disorder). °· Diseases of the female organs: °¨ Uterus: fibroid (non-cancerous) tumors or infection. °¨ Fallopian tubes: infection or tubal pregnancy. °¨ Ovary: cysts or tumors. °¨ Pelvic adhesions (scar tissue). °¨ Endometriosis (uterus lining tissue growing in the pelvis and on the pelvic organs). °¨ Pelvic congestion syndrome (female organs filling up with blood just before the menstrual period). °¨ Pain with the menstrual period. °¨ Pain with ovulation (producing an egg). °¨ Pain with an IUD (intrauterine device, birth control) in the uterus. °¨ Cancer of the female organs. °· Functional pain (pain not caused by a disease, may improve without treatment). °· Psychological pain. °· Depression. °DIAGNOSIS  °Your doctor will decide the seriousness of your pain by doing an examination. °· Blood tests. °· X-rays. °· Ultrasound. °· CT scan  (computed tomography, special type of X-ray). °· MRI (magnetic resonance imaging). °· Cultures, for infection. °· Barium enema (dye inserted in the large intestine, to better view it with X-rays). °· Colonoscopy (looking in intestine with a lighted tube). °· Laparoscopy (minor surgery, looking in abdomen with a lighted tube). °· Major abdominal exploratory surgery (looking in abdomen with a large incision). °TREATMENT  °The treatment will depend on the cause of the pain.  °· Many cases can be observed and treated at home. °· Over-the-counter medicines recommended by your caregiver. °· Prescription medicine. °· Antibiotics, for infection. °· Birth control pills, for painful periods or for ovulation pain. °· Hormone treatment, for endometriosis. °· Nerve blocking injections. °· Physical therapy. °· Antidepressants. °· Counseling with a psychologist or psychiatrist. °· Minor or major surgery. °HOME CARE INSTRUCTIONS  °· Do not take laxatives, unless directed by your caregiver. °· Take over-the-counter pain medicine only if ordered by your caregiver. Do not take aspirin because it can cause an upset stomach or bleeding. °· Try a clear liquid diet (broth or water) as ordered by your caregiver. Slowly move to a bland diet, as tolerated, if the pain is related to the stomach or intestine. °· Have a thermometer and take your temperature several times a day, and record it. °· Bed rest and sleep, if it helps the pain. °· Avoid sexual intercourse, if it causes pain. °· Avoid stressful situations. °· Keep your follow-up appointments and tests, as your caregiver orders. °· If the pain does not go away with medicine or surgery, you may try: °¨ Acupuncture. °¨ Relaxation exercises (yoga, meditation). °¨ Group therapy. °¨ Counseling. °SEEK MEDICAL CARE IF:  °· You notice certain foods cause stomach   pain. °· Your home care treatment is not helping your pain. °· You need stronger pain medicine. °· You want your IUD removed. °· You  feel faint or lightheaded. °· You develop nausea and vomiting. °· You develop a rash. °· You are having side effects or an allergy to your medicine. °SEEK IMMEDIATE MEDICAL CARE IF:  °· Your pain does not go away or gets worse. °· You have a fever. °· Your pain is felt only in portions of the abdomen. The right side could possibly be appendicitis. The left lower portion of the abdomen could be colitis or diverticulitis. °· You are passing blood in your stools (bright red or black tarry stools, with or without vomiting). °· You have blood in your urine. °· You develop chills, with or without a fever. °· You pass out. °MAKE SURE YOU:  °· Understand these instructions. °· Will watch your condition. °· Will get help right away if you are not doing well or get worse. °Document Released: 04/23/2007 Document Revised: 11/10/2013 Document Reviewed: 05/13/2009 °Exit[employer]® Patient Information ©2015 [employer]. This information is not intended to replace advice given to you by your health care provider. Make sure you discuss any questions you have with your health care provider. ° °

## 2015-01-07 NOTE — ED Notes (Signed)
Pt here with diarrhea. Pt states that she has been on multiple antibiotics in the last few weeks. Pt has a hx of c-diff in the past. Pt is in NAD at this time.

## 2015-01-07 NOTE — ED Notes (Addendum)
Pt reports having diarrhea for the last 48 hours with nausea, vomiting and lower abdominal pain below belly button

## 2015-01-08 LAB — POCT PREGNANCY, URINE: Preg Test, Ur: NEGATIVE

## 2015-03-05 ENCOUNTER — Encounter: Payer: Self-pay | Admitting: *Deleted

## 2015-03-05 ENCOUNTER — Ambulatory Visit
Admission: EM | Admit: 2015-03-05 | Discharge: 2015-03-05 | Disposition: A | Discharge status: Home / Self Care | Payer: Medicaid Other | Attending: Family Medicine | Admitting: Family Medicine

## 2015-03-05 DIAGNOSIS — O9989 Other specified diseases and conditions complicating pregnancy, childbirth and the puerperium: Secondary | ICD-10-CM | POA: Diagnosis not present

## 2015-03-05 DIAGNOSIS — Z331 Pregnant state, incidental: Secondary | ICD-10-CM | POA: Insufficient documentation

## 2015-03-05 DIAGNOSIS — R109 Unspecified abdominal pain: Secondary | ICD-10-CM | POA: Insufficient documentation

## 2015-03-05 DIAGNOSIS — O26899 Other specified pregnancy related conditions, unspecified trimester: Secondary | ICD-10-CM

## 2015-03-05 HISTORY — DX: Recurrent pregnancy loss: N96

## 2015-03-05 LAB — CBC WITH DIFFERENTIAL/PLATELET
Basophils Absolute: 0 10*3/uL (ref 0–0.1)
Basophils Relative: 1 %
EOS PCT: 0 %
Eosinophils Absolute: 0 10*3/uL (ref 0–0.7)
HCT: 43 % (ref 35.0–47.0)
Hemoglobin: 14.1 g/dL (ref 12.0–16.0)
LYMPHS ABS: 2.5 10*3/uL (ref 1.0–3.6)
Lymphocytes Relative: 32 %
MCH: 31.3 pg (ref 26.0–34.0)
MCHC: 32.7 g/dL (ref 32.0–36.0)
MCV: 95.9 fL (ref 80.0–100.0)
MONO ABS: 0.8 10*3/uL (ref 0.2–0.9)
Monocytes Relative: 10 %
Neutro Abs: 4.4 10*3/uL (ref 1.4–6.5)
Neutrophils Relative %: 57 %
PLATELETS: 267 10*3/uL (ref 150–440)
RBC: 4.49 MIL/uL (ref 3.80–5.20)
RDW: 13.5 % (ref 11.5–14.5)
WBC: 7.8 10*3/uL (ref 3.6–11.0)

## 2015-03-05 LAB — URINALYSIS COMPLETE WITH MICROSCOPIC (ARMC ONLY)
BILIRUBIN URINE: NEGATIVE
Glucose, UA: NEGATIVE mg/dL
HGB URINE DIPSTICK: NEGATIVE
Ketones, ur: NEGATIVE mg/dL
Leukocytes, UA: NEGATIVE
Nitrite: NEGATIVE
SPECIFIC GRAVITY, URINE: 1.025 (ref 1.005–1.030)
pH: 6.5 (ref 5.0–8.0)

## 2015-03-05 LAB — HCG, QUANTITATIVE, PREGNANCY: hCG, Beta Chain, Quant, S: 11007 m[IU]/mL — ABNORMAL HIGH (ref <5)

## 2015-03-05 NOTE — ED Notes (Signed)
Pt stated that she has had abdominal cramping/pain/tenderness that started about 2:00-3:00pm today, slight dull back pain.  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

## 2015-03-05 NOTE — ED Provider Notes (Signed)
CSN: [id number]     Arrival date & time 03/05/15  1814 History   First MD Initiated Contact with Patient 03/05/15 1858     Chief Complaint  Patient presents with  . Abdominal Cramping   (Consider location/radiation/quality/duration/timing/severity/associated sxs/prior Treatment) Patient is a 29 y.o. female presenting with cramps. The history is provided by the patient. No language interpreter was used.  Abdominal Cramping This is a new problem. The current episode started 3 to 5 hours ago. The problem has not changed since onset.Nothing aggravates the symptoms. Nothing relieves the symptoms. She has tried nothing for the symptoms. The treatment provided no relief.    She is [redacted] weeks pregnant and started having abdominal cramping today. Cramping is often on not too severe but she had concern. This is her ninth pregnancy she is a  G9P1M7. She denies any bleeding or constant discomfort. 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   Past Medical History  Diagnosis Date  . Asthma   . History of multiple miscarriages    Past Surgical History  Procedure Laterality Date  . Cesarean section     History reviewed. No pertinent family history. Social History  Substance Use Topics  . Smoking status: Current Some Day Smoker -- 0.50 packs/day  . Smokeless tobacco: None  . Alcohol Use: No   OB History    Gravida Para Term Preterm AB TAB SAB Ectopic Multiple Living   1              Review of Systems  Constitutional: Negative for chills and appetite change.  Genitourinary: Negative for dysuria, frequency, flank pain, vaginal bleeding, vaginal discharge, enuresis and menstrual problem.  Musculoskeletal: Positive for back pain. Negative for joint swelling.  Skin: Negative for rash.  All other systems reviewed and are negative.   Allergies  Latex and Penicillins Patient does smoke  states she's cut back smoking since she's been pregnant strongly suggest that she stop smoking completely. Last miscarriage prior her to have a D&C.    Home Medications   Prior to Admission medications   Medication Sig Start Date End Date Taking? Authorizing Provider  albuterol (PROVENTIL HFA;VENTOLIN HFA) 108 (90 BASE) MCG/ACT inhaler Inhale into the lungs every 6 (six) hours as needed for wheezing or shortness of breath.   Yes Historical Provider, MD   Meds Ordered and Administered this Visit  Medications - No data to display  BP 121/72 mmHg  Pulse 78  Temp(Src) 98.9 F (37.2 C) (Oral)  Ht 5' (1.524 m)  Wt 173 lb (78.472 kg)  BMI 33.79 kg/m2  SpO2 97%  LMP 11/13/2014 No data found.   Physical Exam  Constitutional: She is oriented to person, place, and time. She appears well-developed and well-nourished.  HENT:  Head: Normocephalic and atraumatic.  Eyes: Pupils are equal, round, and reactive to light.  Neck: Neck supple.  Abdominal: Soft. She exhibits no distension. There is no tenderness.  Musculoskeletal: Normal range of motion.  Neurological: She is alert and oriented to person, place, and time.  Skin: Skin is warm.  Psychiatric: She has a normal mood and affect. Her behavior is normal.  Vitals reviewed.   ED Course  Procedures (including critical care time)  Labs Review Labs Reviewed  URINALYSIS COMPLETEWITH MICROSCOPIC [facility] ONLY) - Abnormal; Notable for the following:    APPearance HAZY (*)    Protein,  ur PRESENT (*)    Bacteria, UA FEW (*)    Squamous Epithelial / LPF 0-5 (*)    All other components within normal limits  HCG, QUANTITATIVE, PREGNANCY - Abnormal; Notable for the following:    hCG, Beta Chain, Quant, S 11007 (*)    All other components within normal limits  URINE CULTURE  CBC WITH DIFFERENTIAL/PLATELET   Results for orders placed or performed during the hospital encounter of 03/05/15  Urinalysis complete, with microscopic  Result Value  Ref Range   Color, Urine YELLOW YELLOW   APPearance HAZY (A) CLEAR   Glucose, UA NEGATIVE NEGATIVE mg/dL   Bilirubin Urine NEGATIVE NEGATIVE   Ketones, ur NEGATIVE NEGATIVE mg/dL   Specific Gravity, Urine 1.025 1.005 - 1.030   Hgb urine dipstick NEGATIVE NEGATIVE   pH 6.5 5.0 - 8.0   Protein, ur PRESENT (A) NEGATIVE mg/dL   Nitrite NEGATIVE NEGATIVE   Leukocytes, UA NEGATIVE NEGATIVE   RBC / HPF 0-5 <3 RBC/hpf   WBC, UA 0-5 <3 WBC/hpf   Bacteria, UA FEW (A) RARE   Squamous Epithelial / LPF 0-5 (A) RARE  hCG, quantitative, pregnancy  Result Value Ref Range   hCG, Beta Chain, Quant, S 11007 (H) <5 mIU/mL  CBC with Differential  Result Value Ref Range   WBC 7.8 3.6 - 11.0 K/uL   RBC 4.49 3.80 - 5.20 MIL/uL   Hemoglobin 14.1 12.0 - 16.0 g/dL   HCT 16.1 09.6 - 04.5 %   MCV 95.9 80.0 - 100.0 fL   MCH 31.3 26.0 - 34.0 pg   MCHC 32.7 32.0 - 36.0 g/dL   RDW 40.9 81.1 - 91.4 %   Platelets 267 150 - 440 K/uL   Neutrophils Relative % 57 %   Neutro Abs 4.4 1.4 - 6.5 K/uL   Lymphocytes Relative 32 %   Lymphs Abs 2.5 1.0 - 3.6 K/uL   Monocytes Relative 10 %   Monocytes Absolute 0.8 0.2 - 0.9 K/uL   Eosinophils Relative 0 %   Eosinophils Absolute 0.0 0 - 0.7 K/uL   Basophils Relative 1 %   Basophils Absolute 0.0 0 - 0.1 K/uL  Imaging Review No results found.   Visual Acuity Review  Right Eye Distance:   Left Eye Distance:   Bilateral Distance:    Right Eye Near:   Left Eye Near:    Bilateral Near:         MDM   1. Abdominal cramping complicating pregnancy     Currently on CBC is normal UAs also normal. Because both are normal will wait for the serum hCG. If ACG shows that the pregnancy is consistent with 6-8 weeks will do nothing further unless urine C&S comes back positive. Should be noted that she has a follow-up appointment with her OB at specialty clinic [facility] she thinks at that time they'll do the ultrasound as well. I will see bleeding occurs or  if symptoms get worse she needs go to the ED of her choice.  Hassan Rowan, MD 03/05/15 2110

## 2015-03-05 NOTE — Discharge Instructions (Signed)
Abdominal Pain During Pregnancy °Belly (abdominal) pain is common during pregnancy. Most of the time, it is not a serious problem. Other times, it can be a sign that something is wrong with the pregnancy. Always tell your doctor if you have belly pain. °HOME CARE °Monitor your belly pain for any changes. The following actions may help you feel better: °· Do not have sex (intercourse) or put anything in your vagina until you feel better. °· Rest until your pain stops. °· Drink clear fluids if you feel sick to your stomach (nauseous). Do not eat solid food until you feel better. °· Only take medicine as told by your doctor. °· Keep all doctor visits as told. °GET HELP RIGHT AWAY IF:  °· You are bleeding, leaking fluid, or pieces of tissue come out of your vagina. °· You have more pain or cramping. °· You keep throwing up (vomiting). °· You have pain when you pee (urinate) or have blood in your pee. °· You have a fever. °· You do not feel your baby moving as much. °· You feel very weak or feel like passing out. °· You have trouble breathing, with or without belly pain. °· You have a very bad headache and belly pain. °· You have fluid leaking from your vagina and belly pain. °· You keep having watery poop (diarrhea). °· Your belly pain does not go away after resting, or the pain gets worse. °MAKE SURE YOU:  °· Understand these instructions. °· Will watch your condition. °· Will get help right away if you are not doing well or get worse. °Document Released: 06/14/2009 Document Revised: 02/26/2013 Document Reviewed: 01/23/2013 °ExitCare® Patient Information ©2015 [employer]. This information is not intended to replace advice given to you by your health care provider. Make sure you discuss any questions you have with your health care provider. ° °

## 2015-03-07 LAB — URINE CULTURE

## 2015-03-21 DIAGNOSIS — F502 Bulimia nervosa, unspecified: Secondary | ICD-10-CM

## 2015-03-21 HISTORY — DX: Bulimia nervosa: F50.2

## 2015-03-21 HISTORY — DX: Bulimia nervosa, unspecified: F50.20

## 2015-04-15 DIAGNOSIS — M7989 Other specified soft tissue disorders: Secondary | ICD-10-CM | POA: Insufficient documentation

## 2015-04-26 DIAGNOSIS — E669 Obesity, unspecified: Secondary | ICD-10-CM

## 2015-04-26 HISTORY — DX: Obesity, unspecified: E66.9

## 2015-05-18 ENCOUNTER — Ambulatory Visit
Admission: EM | Admit: 2015-05-18 | Discharge: 2015-05-18 | Disposition: A | Discharge status: Home / Self Care | Payer: Medicaid Other

## 2015-05-18 ENCOUNTER — Encounter: Payer: Self-pay | Admitting: Emergency Medicine

## 2015-05-18 DIAGNOSIS — O2691 Pregnancy related conditions, unspecified, first trimester: Secondary | ICD-10-CM | POA: Diagnosis not present

## 2015-05-18 NOTE — ED Notes (Signed)
Patient c/o of abdominal cramps, vaginal bleeding that started this morning.  Patient states that she is [redacted] weeks pregnant and has pre-eclampsia with her previous pregnancy.

## 2015-05-18 NOTE — ED Provider Notes (Signed)
CSN: [id number]     Arrival date & time 05/18/15  [id number] History   None    Chief Complaint  Patient presents with  . Abdominal Pain  . Vaginal Bleeding   (Consider location/radiation/quality/duration/timing/severity/associated sxs/prior Treatment) HPI    A 29 year old female who is [redacted] weeks pregnant presenting with abdominal cramping and light bleeding. She states that her first pregnancy ended with a placenta abruptio necessitating a C-section at 7 months,producinga living  baby girl that was 1 pound and several ounces. Since becoming pregnant at this time she has been in a high risk OB practice [facility]. She states that she's had several episodes of bleeding during this pregnancy which is being followed by her OB. This time it is accompanied by the cramping and more bleeding than she has had in the past. She has no other symptoms and appears stable. She did have preeclampsia previously. States that it was her first day on the job at [employer]. She denies any lightheadedness or dizziness. Her abdominal cramps are colicky in nature  Past Medical History  Diagnosis Date  . Asthma   . History of multiple miscarriages    Past Surgical History  Procedure Laterality Date  . Cesarean section     History reviewed. No pertinent family history. Social History  Substance Use Topics  . Smoking status: Current Some Day Smoker -- 0.50 packs/day  . Smokeless tobacco: None  . Alcohol Use: No   OB History    Gravida Para Term Preterm AB TAB SAB Ectopic Multiple Living   1              Review of Systems  Constitutional: Positive for activity change. Negative for fever, chills, diaphoresis and fatigue.  Neurological: Negative for dizziness, light-headedness and numbness.  Psychiatric/Behavioral: Negative for confusion. The patient is not nervous/anxious.     Allergies  Latex and Penicillins  Home Medications   Prior to Admission medications   Medication Sig Start Date End Date Taking?  Authorizing Provider  albuterol (PROVENTIL HFA;VENTOLIN HFA) 108 (90 BASE) MCG/ACT inhaler Inhale into the lungs every 6 (six) hours as needed for wheezing or shortness of breath.    Historical Provider, MD   Meds Ordered and Administered this Visit  Medications - No data to display  BP 114/59 mmHg  Pulse 79  Temp(Src) 97 F (36.1 C) (Tympanic)  Resp 16  SpO2 100%  LMP 11/13/2014 No data found.   Physical Exam  Constitutional: She is oriented to person, place, and time. She appears well-developed and well-nourished. No distress.  Patient is alert oriented in no acute distress. Vital signs are normal with a BP 114/59 SPO2 100% respirations 16 pulse 79 temperature 97 degrees  HENT:  Head: Normocephalic and atraumatic.  Eyes: Pupils are equal, round, and reactive to light.  Neck: Normal range of motion. Neck supple.  Abdominal: Soft. Bowel sounds are normal.  Neurological: She is alert and oriented to person, place, and time.  Skin: Skin is warm and dry. She is not diaphoretic.  Psychiatric: She has a normal mood and affect. Her behavior is normal. Judgment and thought content normal.  Nursing note and vitals reviewed.   ED Course  Procedures (including critical care time)  Labs Review Labs Reviewed - No data to display  Imaging Review No results found.   Visual Acuity Review  Right Eye Distance:   Left Eye Distance:   Bilateral Distance:    Right Eye Near:   Left Eye Near:  Bilateral Near:         MDM   1. Pregnancy with complication, first trimester     This patient is stable at this point time. In No Distress. I recommended that the patient go to [facility] emergency department she can be evaluated by her OB. She is stable at this time I do believe that she is able to drive herself to [facility]. Answered her questions in detail. She stated she would contact her OB and the emergency department on her way over there.    Lutricia Feil, PA-C 05/18/15 [phone]

## 2015-08-19 DIAGNOSIS — O26899 Other specified pregnancy related conditions, unspecified trimester: Secondary | ICD-10-CM

## 2015-08-19 DIAGNOSIS — O368130 Decreased fetal movements, third trimester, not applicable or unspecified: Secondary | ICD-10-CM

## 2015-08-19 HISTORY — DX: Other specified pregnancy related conditions, unspecified trimester: O26.899

## 2015-08-19 HISTORY — DX: Decreased fetal movements, third trimester, not applicable or unspecified: O36.8130

## 2016-07-06 ENCOUNTER — Ambulatory Visit
Admission: EM | Admit: 2016-07-06 | Discharge: 2016-07-06 | Discharge status: Absent without leave | Payer: Medicaid Other

## 2017-01-03 DIAGNOSIS — R7689 Other specified abnormal immunological findings in serum: Secondary | ICD-10-CM | POA: Insufficient documentation

## 2017-01-03 DIAGNOSIS — R768 Other specified abnormal immunological findings in serum: Secondary | ICD-10-CM

## 2017-01-03 HISTORY — DX: Other specified abnormal immunological findings in serum: R76.89

## 2017-01-03 HISTORY — DX: Other specified abnormal immunological findings in serum: R76.8

## 2017-02-07 ENCOUNTER — Ambulatory Visit
Admission: EM | Admit: 2017-02-07 | Discharge: 2017-02-07 | Disposition: A | Discharge status: Home / Self Care | Payer: Medicaid Other | Attending: Family Medicine | Admitting: Family Medicine

## 2017-02-07 DIAGNOSIS — S161XXA Strain of muscle, fascia and tendon at neck level, initial encounter: Secondary | ICD-10-CM

## 2017-02-07 DIAGNOSIS — S39012A Strain of muscle, fascia and tendon of lower back, initial encounter: Secondary | ICD-10-CM

## 2017-02-07 DIAGNOSIS — S20219A Contusion of unspecified front wall of thorax, initial encounter: Secondary | ICD-10-CM | POA: Diagnosis not present

## 2017-02-07 HISTORY — DX: Anxiety disorder, unspecified: F41.9

## 2017-02-07 MED ORDER — CYCLOBENZAPRINE HCL 10 MG PO TABS: 10.0000 mg | ORAL_TABLET | Freq: Every day | ORAL | 0 refills | Status: DC

## 2017-02-07 MED ORDER — HYDROCODONE-ACETAMINOPHEN 5-325 MG PO TABS: ORAL_TABLET | ORAL | 0 refills | Status: DC
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
## 2017-02-07 NOTE — Discharge Instructions (Signed)
Rest, ice, over the counter tylenol as needed 
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
## 2017-03-01 DIAGNOSIS — F0781 Postconcussional syndrome: Secondary | ICD-10-CM

## 2017-03-01 HISTORY — DX: Postconcussional syndrome: F07.81

## 2017-12-26 ENCOUNTER — Other Ambulatory Visit: Payer: Self-pay

## 2017-12-26 ENCOUNTER — Encounter: Payer: Self-pay | Admitting: Emergency Medicine

## 2017-12-26 ENCOUNTER — Emergency Department
Admission: EM | Admit: 2017-12-26 | Discharge: 2017-12-26 | Disposition: A | Discharge status: Home / Self Care | Payer: Self-pay | Attending: Emergency Medicine | Admitting: Emergency Medicine

## 2017-12-26 DIAGNOSIS — F419 Anxiety disorder, unspecified: Secondary | ICD-10-CM | POA: Insufficient documentation

## 2017-12-26 DIAGNOSIS — F17200 Nicotine dependence, unspecified, uncomplicated: Secondary | ICD-10-CM | POA: Insufficient documentation

## 2017-12-26 DIAGNOSIS — Z79899 Other long term (current) drug therapy: Secondary | ICD-10-CM | POA: Insufficient documentation

## 2017-12-26 DIAGNOSIS — J45909 Unspecified asthma, uncomplicated: Secondary | ICD-10-CM | POA: Insufficient documentation

## 2017-12-26 LAB — CBC
HEMATOCRIT: 39.3 % (ref 35.0–47.0)
HEMOGLOBIN: 13.4 g/dL (ref 12.0–16.0)
MCH: 31.7 pg (ref 26.0–34.0)
MCHC: 34.2 g/dL (ref 32.0–36.0)
MCV: 92.5 fL (ref 80.0–100.0)
Platelets: 290 10*3/uL (ref 150–440)
RBC: 4.24 MIL/uL (ref 3.80–5.20)
RDW: 12.9 % (ref 11.5–14.5)
WBC: 8.8 10*3/uL (ref 3.6–11.0)

## 2017-12-26 LAB — URINALYSIS, COMPLETE (UACMP) WITH MICROSCOPIC
BACTERIA UA: NONE SEEN
Bilirubin Urine: NEGATIVE
Glucose, UA: NEGATIVE mg/dL
HGB URINE DIPSTICK: NEGATIVE
Ketones, ur: NEGATIVE mg/dL
Leukocytes, UA: NEGATIVE
NITRITE: NEGATIVE
PH: 7 (ref 5.0–8.0)
Protein, ur: NEGATIVE mg/dL
SPECIFIC GRAVITY, URINE: 1.002 — AB (ref 1.005–1.030)
WBC, UA: NONE SEEN WBC/hpf (ref 0–5)

## 2017-12-26 LAB — POCT PREGNANCY, URINE: Preg Test, Ur: NEGATIVE

## 2017-12-26 LAB — COMPREHENSIVE METABOLIC PANEL
ALT: 16 U/L (ref 14–54)
ANION GAP: 8 (ref 5–15)
AST: 18 U/L (ref 15–41)
Albumin: 3.3 g/dL — ABNORMAL LOW (ref 3.5–5.0)
Alkaline Phosphatase: 78 U/L (ref 38–126)
BILIRUBIN TOTAL: 0.6 mg/dL (ref 0.3–1.2)
BUN: 8 mg/dL (ref 6–20)
CALCIUM: 8.3 mg/dL — AB (ref 8.9–10.3)
CO2: 21 mmol/L — ABNORMAL LOW (ref 22–32)
CREATININE: 0.72 mg/dL (ref 0.44–1.00)
Chloride: 106 mmol/L (ref 101–111)
Glucose, Bld: 84 mg/dL (ref 65–99)
Potassium: 3.9 mmol/L (ref 3.5–5.1)
Sodium: 135 mmol/L (ref 135–145)
TOTAL PROTEIN: 6.6 g/dL (ref 6.5–8.1)

## 2017-12-26 LAB — TSH: TSH: 1.687 u[IU]/mL (ref 0.350–4.500)

## 2017-12-26 MED ORDER — HYDROXYZINE HCL 10 MG PO TABS: 10.0000 mg | ORAL_TABLET | Freq: Three times a day (TID) | ORAL | 0 refills | Status: DC | PRN

## 2017-12-26 NOTE — ED Triage Notes (Addendum)
PT to ED via POV with c/o bilat feet swelling and RT arm numbness x2wks. Pt denies injuries. 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 VSS. PT ambulatory. Speech clear. NAD noted . Pt states panic attacks lately due to increased stress with moving and jobs.
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
## 2019-02-26 ENCOUNTER — Ambulatory Visit
Admission: EM | Admit: 2019-02-26 | Discharge: 2019-02-26 | Disposition: A | Discharge status: Home / Self Care | Payer: Medicaid Other | Attending: Family Medicine | Admitting: Family Medicine

## 2019-02-26 ENCOUNTER — Other Ambulatory Visit: Payer: Self-pay

## 2019-02-26 DIAGNOSIS — S61211A Laceration without foreign body of left index finger without damage to nail, initial encounter: Secondary | ICD-10-CM | POA: Diagnosis not present

## 2019-02-26 DIAGNOSIS — W260XXA Contact with knife, initial encounter: Secondary | ICD-10-CM

## 2019-02-26 NOTE — ED Provider Notes (Signed)
[facility] ____________________________________________  Time seen: Approximately 1:19 PM  I have reviewed the triage vital signs and the nursing notes.   HISTORY  Chief Complaint Laceration   HPI Jordan Callahan is a 33 y.o. female presenting for evaluation of left hand second digit laceration that occurred just prior to arrival.  Patient states that she was using a kitchen knife to separate frozen hotdogs and her hand slipped causing the laceration.  States pain directly to the cut area.  Denies decreased range of motion, paresthesias or other pain.  Tetanus immunization is up-to-date, last given 3 years ago.  Denies alleviating factors.  No recent cough, congestion, sore throat.  [name], MD: PCP Patient's last menstrual period was 02/05/2019.   Past Medical History:  Diagnosis Date  . Anxiety   . Asthma   . History of multiple miscarriages     There are no active problems to display for this patient.   Past Surgical History:  Procedure Laterality Date  . CESAREAN SECTION    . TUBAL LIGATION       No current facility-administered medications for this encounter.   Current Outpatient Medications:  .  albuterol (PROVENTIL HFA;VENTOLIN HFA) 108 (90 BASE) MCG/ACT inhaler, Inhale into the lungs every 6 (six) hours as needed for wheezing or shortness of breath., Disp: , Rfl:  .  hydrOXYzine (ATARAX/VISTARIL) 10 MG tablet, Take 1 tablet (10 mg total) by mouth 3 (three) times daily as needed., Disp: 30 tablet, Rfl: 0 .  cyclobenzaprine (FLEXERIL) 10 MG tablet, Take 1 tablet (10 mg total) by mouth at bedtime., Disp: 30 tablet, Rfl: 0 .  HYDROcodone-acetaminophen (NORCO/VICODIN) 5-325 MG tablet, 1-2 tabs po qd prn, Disp: 6 tablet, Rfl: 0 .  lamoTRIgine (LAMICTAL) 25 MG tablet, Take 25 mg by mouth daily., Disp: , Rfl:   Allergies Latex and Penicillins  History reviewed. No pertinent family history.  Social History Social History   Tobacco Use  .  Smoking status: Current Every Day Smoker    Packs/day: 0.50    Types: Cigarettes  . Smokeless tobacco: Never Used  Substance Use Topics  . Alcohol use: Yes    Comment: occasionally  . Drug use: No    Review of Systems Constitutional: No fever ENT: No sore throat. Cardiovascular: Denies chest pain. Respiratory: Denies shortness of breath. Gastrointestinal: No abdominal pain.   Skin: Positive laceration.   ____________________________________________   PHYSICAL EXAM:  VITAL SIGNS: ED Triage Vitals [02/26/19 1234]  Enc Vitals Group     BP 121/85     Pulse Rate 76     Resp 16     Temp 99 F (37.2 C)     Temp Source Oral     SpO2 99 %     Weight 175 lb (79.4 kg)     Height 5' (1.524 m)     Head Circumference      Peak Flow      Pain Score 7     Pain Loc      Pain Edu?      Excl. in [place]?     Constitutional: Alert and oriented. Well appearing and in no acute distress. Eyes: Conjunctivae are normal. ENT      Head: Normocephalic and atraumatic. Cardiovascular: Normal rate, regular rhythm. Grossly normal heart sounds.  Good peripheral circulation. Respiratory: Normal respiratory effort without tachypnea nor retractions. Breath sounds are clear and equal bilaterally. No wheezes, rales, rhonchi. Musculoskeletal:  Steady gait.  Neurologic:  Normal speech  and language. Speech is normal. No gait instability.  Skin:  Skin is warm, dry. Except: Left hand second digit proximal palmar aspect of proximal phalanx 0.5 cm superficial laceration present with minimal active bleeding, tenderness to direct palpation, full range of motion present, no motor or tendon deficit noted.  Left hand otherwise nontender. Psychiatric: Mood and affect are normal. Speech and behavior are normal. Patient exhibits appropriate insight and judgment   ___________________________________________   LABS (all labs ordered are listed, but only abnormal results are displayed)  Labs Reviewed - No data to  display ____________________________________________  PROCEDURES Procedures   Procedure(s) performed:  Procedure explained and verbal consent obtained. Consent: Verbal consent obtained. Written consent not obtained. Risks and benefits: risks, benefits and alternatives were discussed Patient identity confirmed: verbally with patient and hospital-assigned identification number  Consent given by: patient   Laceration Repair Location: left index finger Length: 0.5 cm Foreign bodies: no foreign bodies Tendon involvement: none Nerve involvement: none Preparation: Patient was prepped and draped in the usual sterile fashion. Anesthesia: none Cleaned with betadine Irrigation solution: saline Irrigation method: jet lavage Amount of cleaning: copious Repaired with dermabond Patient tolerate well. Wound well approximated post repair.  Dressing and finger splint applied.  Wound care instructions provided. Observe for any signs of infection or other problems.       INITIAL IMPRESSION / ASSESSMENT AND PLAN / ED COURSE  Pertinent labs & imaging results that were available during my care of the patient were reviewed by me and considered in my medical decision making (see chart for details).  Well-appearing patient.  No acute distress.  Left hand laceration as above.  Copiously cleaned and repaired.  Patient tolerated well.  Keep clean and dry.  Supportive care.  Work note given.  Discussed follow up and return parameters including no resolution or any worsening concerns. Patient verbalized understanding and agreed to plan.   ____________________________________________   FINAL CLINICAL IMPRESSION(S) / ED DIAGNOSES  Final diagnoses:  Laceration of left index finger without foreign body without damage to nail, initial encounter     ED Discharge Orders    None       Note: This dictation was prepared with Dragon dictation along with smaller phrase technology. Any transcriptional  errors that result from this process are unintentional.         Renford DillsMiller, Lizbeth Feijoo, NP 02/26/19 1411

## 2019-02-26 NOTE — ED Triage Notes (Signed)
Patient has a laceration to her left index finger that occurred with a knife. States that knife punctured in her finger and went down.

## 2019-02-26 NOTE — Discharge Instructions (Signed)
Keep clean and dry.   Follow up with your primary care physician this week as needed. Return to Urgent care for new or worsening concerns.

## 2019-05-28 DIAGNOSIS — G43009 Migraine without aura, not intractable, without status migrainosus: Secondary | ICD-10-CM

## 2019-05-28 HISTORY — DX: Migraine without aura, not intractable, without status migrainosus: G43.009

## 2019-06-04 DIAGNOSIS — F331 Major depressive disorder, recurrent, moderate: Secondary | ICD-10-CM | POA: Insufficient documentation

## 2019-06-04 HISTORY — DX: Major depressive disorder, recurrent, moderate: F33.1

## 2019-07-16 ENCOUNTER — Other Ambulatory Visit: Payer: Self-pay | Admitting: Family Medicine

## 2019-07-16 ENCOUNTER — Ambulatory Visit
Admission: RE | Admit: 2019-07-16 | Discharge: 2019-07-16 | Disposition: A | Discharge status: Home / Self Care | Payer: Medicaid Other | Source: Ambulatory Visit | Attending: Family Medicine | Admitting: Family Medicine

## 2019-07-16 ENCOUNTER — Encounter (INDEPENDENT_AMBULATORY_CARE_PROVIDER_SITE_OTHER): Payer: Self-pay

## 2019-07-16 ENCOUNTER — Other Ambulatory Visit: Payer: Self-pay

## 2019-07-16 DIAGNOSIS — R1032 Left lower quadrant pain: Secondary | ICD-10-CM

## 2019-07-16 DIAGNOSIS — R109 Unspecified abdominal pain: Secondary | ICD-10-CM

## 2019-07-16 MED ORDER — IOHEXOL 300 MG/ML  SOLN
100.0000 mL | Freq: Once | INTRAMUSCULAR | Status: AC | PRN
  Administered 2019-07-16: 100 mL via INTRAVENOUS

## 2019-07-23 ENCOUNTER — Other Ambulatory Visit: Payer: Self-pay | Admitting: Family Medicine

## 2019-07-23 DIAGNOSIS — R109 Unspecified abdominal pain: Secondary | ICD-10-CM

## 2019-07-23 DIAGNOSIS — N9489 Other specified conditions associated with female genital organs and menstrual cycle: Secondary | ICD-10-CM

## 2019-07-23 DIAGNOSIS — R1032 Left lower quadrant pain: Secondary | ICD-10-CM

## 2019-07-24 ENCOUNTER — Ambulatory Visit: Admission: RE | Admit: 2019-07-24 | Payer: Medicaid Other | Source: Ambulatory Visit

## 2019-07-24 ENCOUNTER — Ambulatory Visit
Admission: RE | Admit: 2019-07-24 | Discharge: 2019-07-24 | Disposition: A | Discharge status: Home / Self Care | Payer: Medicaid Other | Source: Ambulatory Visit | Attending: Family Medicine | Admitting: Family Medicine

## 2019-07-24 ENCOUNTER — Other Ambulatory Visit: Payer: Self-pay

## 2019-07-24 DIAGNOSIS — N9489 Other specified conditions associated with female genital organs and menstrual cycle: Secondary | ICD-10-CM | POA: Diagnosis not present

## 2019-07-24 DIAGNOSIS — R109 Unspecified abdominal pain: Secondary | ICD-10-CM | POA: Diagnosis present

## 2019-08-12 ENCOUNTER — Other Ambulatory Visit: Payer: Self-pay

## 2019-08-12 DIAGNOSIS — N3010 Interstitial cystitis (chronic) without hematuria: Secondary | ICD-10-CM

## 2019-08-15 ENCOUNTER — Other Ambulatory Visit
Admission: RE | Admit: 2019-08-15 | Discharge: 2019-08-15 | Disposition: A | Discharge status: Home / Self Care | Payer: Medicaid Other | Attending: Urology | Admitting: Urology

## 2019-08-15 ENCOUNTER — Encounter: Payer: Self-pay | Admitting: Urology

## 2019-08-15 ENCOUNTER — Other Ambulatory Visit: Payer: Self-pay

## 2019-08-15 ENCOUNTER — Ambulatory Visit (INDEPENDENT_AMBULATORY_CARE_PROVIDER_SITE_OTHER): Payer: Medicaid Other | Admitting: Urology

## 2019-08-15 VITALS — BP 114/67 | HR 84 | Ht 60.0 in | Wt 175.0 lb

## 2019-08-15 DIAGNOSIS — R102 Pelvic and perineal pain: Secondary | ICD-10-CM | POA: Diagnosis not present

## 2019-08-15 DIAGNOSIS — N3010 Interstitial cystitis (chronic) without hematuria: Secondary | ICD-10-CM

## 2019-08-15 DIAGNOSIS — R3915 Urgency of urination: Secondary | ICD-10-CM | POA: Diagnosis not present

## 2019-08-15 DIAGNOSIS — R300 Dysuria: Secondary | ICD-10-CM | POA: Diagnosis not present

## 2019-08-15 DIAGNOSIS — F419 Anxiety disorder, unspecified: Secondary | ICD-10-CM | POA: Diagnosis not present

## 2019-08-15 LAB — URINALYSIS, COMPLETE (UACMP) WITH MICROSCOPIC
Bilirubin Urine: NEGATIVE
Glucose, UA: NEGATIVE mg/dL
Hgb urine dipstick: NEGATIVE
Ketones, ur: NEGATIVE mg/dL
Nitrite: NEGATIVE
Protein, ur: NEGATIVE mg/dL
RBC / HPF: NONE SEEN RBC/hpf (ref 0–5)
Specific Gravity, Urine: 1.02 (ref 1.005–1.030)
pH: 7 (ref 5.0–8.0)

## 2019-08-15 MED ORDER — OXYBUTYNIN CHLORIDE ER 10 MG PO TB24: 10.0000 mg | ORAL_TABLET | Freq: Every day | ORAL | 3 refills | Status: DC

## 2019-08-15 MED ORDER — URIBEL 118 MG PO CAPS
1.0000 | ORAL_CAPSULE | Freq: Four times a day (QID) | ORAL | 1 refills | Status: DC | PRN
Start: 2019-08-15 — End: 2019-08-29

## 2019-08-15 NOTE — Progress Notes (Signed)
08/15/2019 12:25 PM   Jordan Callahan [birth year] [id number]  Referring provider: [name], MD [address] [place],  [place] [address]  Chief Complaint  Patient presents with  . IC    New Patient    HPI: 34 year old female who presents today for further evaluation of urinary symptoms referred by Dr. [name].  Notably, she has been having significant pelvic pain since July 14, 2019.  She described this is severe cramping right lower quadrant pain that was so severe that she was bedridden for a few days.  Documentation indicates that this may have been left-sided but today she lateralizes to the right.  She was seen and evaluated by her primary care a few days later at which time a CT scan was ordered.  Her urine at the time was negative.  She was treated for presumed urinary tract infection despite having a negative urine.  She underwent a CT abdomen pelvis in January 2021 found to have right hydrosalpinx and some inflammation/stranding around her bladder.  Follow-up vaginal ultrasound 2 weeks later shows persistent fluid collection around her right tube concerning for possible tubo-ovarian abscess.  Recent pelvic exam performed by Dr. [name] was fairly unremarkable other than for diffuse tenderness.  She did have a endometrial biopsy all to well, results are unavailable to me today.  She has been treated for presumed urinary tract infection although both urines from 2021 have been negative.  She did have blood in her urine on 07/30/2019 however on this occasion she was menstruating.  Urine cultures were negative.  Chlamydia gonorrhea negative.  She has been taking Levaquin and Flagyl for the past 2 weeks and is taking her last dose today again she believes for "urinary tract infection".  She continues to have significant pelvic pain which comes and goes primarily as crampy pain lateralizing to the right greater than left.  She also reports associated urinary  urgency frequency and burning with urination.  She reports having a "pinching" sensation at her urethra every time she urinates.  She is able to be sexually active with some cramping afterwards.  No exacerbating or alleviating symptoms.  She has a personal history of C. difficile.  She is worried that all his antibiotics are going to cause her to have problems again.  She is not experiencing symptoms of C. difficile at this time.  She is very anxious today.  She is tearful at times.  She feels like she is being bounced around from doctor to doctor without getting any real answers.  She is feeling so anxious that she is unable to work.  She has no history of this happening before.  PMH: Past Medical History:  Diagnosis Date  . Abdominal pain affecting pregnancy, antepartum 08/19/2015  . Anxiety   . Asthma   . Asthma, allergic, moderate persistent, with acute exacerbation 11/22/2014  . Bipolar 1 disorder ([place]) 05/05/2013  . Bulimia 03/21/2015   Overview:  Overview:  Remote history, no active issues Associated with h/o GERD, pancreatitis, and stomach ulcers [ ]  Nutrition [ ]  Low threshold to start PPI / H2 blocker if symptoms  Last Assessment & Plan:  Body mass index is 33.86 kg/(m^2). without any active issues.  Nutrition and perinatal psych consults placed for additional counseling and support in this pregnancy.  Reviewed recommended TW  . Decreased fetus movements affecting management of mother in third trimester 08/19/2015   Overview:  08/19/2015 Eval in L&D triage for decreased fm and abd pains. NST  R and AFI normal, FM increased while in triage. Work up for abd pain negative - likely musculoskeletal discomforts. Discharged with precautions, comfort measures, kick counts. Monitor, reeval prn.  . DUB (dysfunctional uterine bleeding) 05/05/2013  . History of Clostridium difficile infection 05/19/2014  . History of multiple miscarriages   . 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 Major depressive disorder,  recurrent, moderate (HCC) 06/04/2019  . Migraine without aura and without status migrainosus, not intractable 05/28/2019  . Mood disorder (HCC) 05/05/2013  . 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 Obesity (BMI 30-39.9) 04/26/2015   Overview:  Overview:  Hgb A1c 5.1 One hr OGTT at next visit Overview:  Hgb A1c 5.1 One hr OGTT at next visit  . 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 Post concussion syndrome 03/01/2017  . PTSD (post-traumatic stress disorder) 05/05/2013  . Red blood cell antibody positive 01/03/2017    Surgical History: Past Surgical History:  Procedure Laterality Date  . CESAREAN SECTION    . TUBAL LIGATION      Home Medications:  Allergies as of 08/15/2019      Reactions   Latex    Penicillins       Medication List       Accurate as of August 15, 2019 12:25 PM. If you have any questions, ask your nurse or doctor.        albuterol 108 (90 Base) MCG/ACT inhaler Commonly known as: VENTOLIN HFA Inhale into the lungs every 6 (six) hours as needed for wheezing or shortness of breath.   cyclobenzaprine 10 MG tablet Commonly known as: FLEXERIL Take 1 tablet (10 mg total) by mouth at bedtime.   HYDROcodone-acetaminophen 5-325 MG tablet Commonly known as: NORCO/VICODIN 1-2 tabs po qd prn   hydrOXYzine 10 MG tablet Commonly known as: ATARAX/VISTARIL Take 1 tablet (10 mg total) by mouth 3 (three) times daily as needed.   lamoTRIgine 25 MG tablet Commonly known as: LAMICTAL Take 25 mg by mouth daily.   Levaquin 500 MG tablet Generic drug: levofloxacin Take 500 mg by mouth daily.   metroNIDAZOLE 250 MG tablet Commonly known as: FLAGYL Take 250 mg by mouth 3 (three) times daily.   oxybutynin 10 MG 24 hr tablet Commonly known as: DITROPAN-XL Take 1 tablet (10 mg total) by mouth daily. Started by: Vanna Scotland, MD   Uribel 118 MG Caps Take 1 capsule (118 mg total) by mouth 4 (four) times daily as needed (dysuria). Started by:  Vanna Scotland, MD       Allergies:  Allergies  Allergen Reactions  . Latex   . Penicillins     Family History: Family History  Problem Relation Age of Onset  . Bladder Cancer Neg Hx   . Prostate cancer Neg Hx   . Kidney cancer Neg Hx     Social History:  reports that she has been smoking cigarettes. She has been smoking about 0.50 packs per day. She has never used smokeless tobacco. She reports current alcohol use. She reports that she does not use drugs.  ROS: UROLOGY Frequent Urination?: Yes Hard to postpone urination?: Yes Burning/pain with urination?: Yes Get up at night to urinate?: Yes Leakage of urine?: Yes Urine stream starts and stops?: Yes Trouble starting stream?: Yes Do you have to strain to urinate?: Yes Blood in urine?: Yes Urinary tract infection?: Yes Sexually transmitted disease?: No Injury to kidneys or bladder?: No Painful intercourse?: Yes Weak stream?: Yes Currently pregnant?: No Vaginal bleeding?: Yes  Gastrointestinal  Nausea?: Yes Vomiting?: Yes Indigestion/heartburn?: Yes Diarrhea?: Yes Constipation?: No  Constitutional Fever: No Night sweats?: Yes Weight loss?: Yes Fatigue?: Yes  Skin Skin rash/lesions?: Yes Itching?: No  Eyes Blurred vision?: No Double vision?: No  Ears/Nose/Throat Sore throat?: No Sinus problems?: No  Hematologic/Lymphatic Swollen glands?: No Easy bruising?: Yes  Cardiovascular Leg swelling?: No Chest pain?: No  Respiratory Cough?: Yes Shortness of breath?: Yes  Endocrine Excessive thirst?: No  Musculoskeletal Back pain?: Yes Joint pain?: No  Neurological Headaches?: Yes Dizziness?: Yes  Psychologic Depression?: No Anxiety?: Yes  Physical Exam: BP 114/67   Pulse 84   Ht 5' (1.524 m)   Wt 175 lb (79.4 kg)   BMI 34.18 kg/m   Constitutional:  Alert and oriented, anxious and tearful at times. HEENT: [place] AT, moist mucus membranes.  Trachea midline, no masses. Cardiovascular: No  clubbing, cyanosis, or edema. Respiratory: Normal respiratory effort, no increased work of breathing. Skin: No rashes, bruises or suspicious lesions. Neurologic: Grossly intact, no focal deficits, moving all 4 extremities. Psychiatric: Normal mood and affect.  Laboratory Data: Lab Results  Component Value Date   WBC 8.8 12/26/2017   HGB 13.4 12/26/2017   HCT 39.3 12/26/2017   MCV 92.5 12/26/2017   PLT 290 12/26/2017    Lab Results  Component Value Date   CREATININE 0.72 12/26/2017   Extensive my chart review of her most recent labs including from January 2021  Urinalysis Results for orders placed or performed during the hospital encounter of 12/26/17  CBC  Result Value Ref Range   WBC 8.8 3.6 - 11.0 K/uL   RBC 4.24 3.80 - 5.20 MIL/uL   Hemoglobin 13.4 12.0 - 16.0 g/dL   HCT 01.0 93.2 - 35.5 %   MCV 92.5 80.0 - 100.0 fL   MCH 31.7 26.0 - 34.0 pg   MCHC 34.2 32.0 - 36.0 g/dL   RDW 73.2 20.2 - 54.2 %   Platelets 290 150 - 440 K/uL  Comprehensive metabolic panel  Result Value Ref Range   Sodium 135 135 - 145 mmol/L   Potassium 3.9 3.5 - 5.1 mmol/L   Chloride 106 101 - 111 mmol/L   CO2 21 (L) 22 - 32 mmol/L   Glucose, Bld 84 65 - 99 mg/dL   BUN 8 6 - 20 mg/dL   Creatinine, Ser 7.06 0.44 - 1.00 mg/dL   Calcium 8.3 (L) 8.9 - 10.3 mg/dL   Total Protein 6.6 6.5 - 8.1 g/dL   Albumin 3.3 (L) 3.5 - 5.0 g/dL   AST 18 15 - 41 U/L   ALT 16 14 - 54 U/L   Alkaline Phosphatase 78 38 - 126 U/L   Total Bilirubin 0.6 0.3 - 1.2 mg/dL   GFR calc non Af Amer >60 >60 mL/min   GFR calc Af Amer >60 >60 mL/min   Anion gap 8 5 - 15  TSH  Result Value Ref Range   TSH 1.687 0.350 - 4.500 uIU/mL  Urinalysis, Complete w Microscopic  Result Value Ref Range   Color, Urine COLORLESS (A) YELLOW   APPearance CLEAR (A) CLEAR   Specific Gravity, Urine 1.002 (L) 1.005 - 1.030   pH 7.0 5.0 - 8.0   Glucose, UA NEGATIVE NEGATIVE mg/dL   Hgb urine dipstick NEGATIVE NEGATIVE   Bilirubin Urine  NEGATIVE NEGATIVE   Ketones, ur NEGATIVE NEGATIVE mg/dL   Protein, ur NEGATIVE NEGATIVE mg/dL   Nitrite NEGATIVE NEGATIVE   Leukocytes, UA NEGATIVE NEGATIVE   RBC / HPF  0-5 0 - 5 RBC/hpf   WBC, UA NONE SEEN 0 - 5 WBC/hpf   Bacteria, UA NONE SEEN NONE SEEN   Squamous Epithelial / LPF 0-5 0 - 5  Pregnancy, urine POC  Result Value Ref Range   Preg Test, Ur NEGATIVE NEGATIVE    Pertinent Imaging: CLINICAL DATA:  Left lower quadrant abdominal pain with nausea and vomiting. Painful urination and low back pain  EXAM: CT ABDOMEN AND PELVIS WITH CONTRAST  TECHNIQUE: Multidetector CT imaging of the abdomen and pelvis was performed using the standard protocol following bolus administration of intravenous contrast.  CONTRAST:  OMNIPAQUE IOHEXOL 300 MG/ML  SOLN  COMPARISON:  12/24/2011  FINDINGS: Lower chest: No acute abnormality.  Hepatobiliary: Liver is unremarkable in appearance. The gallbladder is contracted, limiting its evaluation. No obvious pericholecystic inflammatory changes. No biliary dilatation.  Pancreas: Unremarkable. No pancreatic ductal dilatation or surrounding inflammatory changes.  Spleen: Normal in size without focal abnormality.  Adrenals/Urinary Tract: Unremarkable adrenal glands. The kidneys enhance symmetrically without focal lesion, calculus, or hydronephrosis. Bilateral ureters are nondilated. Subtle fat stranding adjacent to the anterior aspect of the urinary bladder wall.  Stomach/Bowel: Stomach is within normal limits. Appendix appears normal. No evidence of bowel wall thickening, distention, or inflammatory changes.  Vascular/Lymphatic: No significant vascular findings are present. No enlarged abdominal or pelvic lymph nodes.  Reproductive: Rounded 9 mm low-density lesion at the uterine fundus on the right suggesting a fibroid. Uterus is otherwise unremarkable in appearance. 2.4 cm left ovarian cyst/follicle. There is  a lobulated is, slightly tubular low-density structure in the region of the right adnexa which could represent fluid distension of the right fallopian tube.  Other: No free fluid within the pelvis. No abdominal ascites. No abdominal wall hernias.  Musculoskeletal: No acute or significant osseous findings.  IMPRESSION: 1. Subtle fat stranding adjacent to the anterior aspect of the urinary bladder wall raises suspicion for cystitis. Correlation with urinalysis recommended. 2. Lobulated, somewhat tubular low-density structure in the region of the right adnexa which could represent fluid distension of the right fallopian tube. Pelvic ultrasound is recommended for further evaluation. 3. Otherwise, no acute abdominopelvic findings. 4. Subcentimeter fundal uterine fibroid.   Electronically Signed   By: Duanne Guess D.O.   On: 07/16/2019 14:00  CT personally reviewed.  Bladder itself appears to be fairly unremarkable, normal walls with smooth contour.  There is very subtle stranding which is nonspecific and underwhelming.  Assessment & Plan:    1. Pelvic pain in female CT scan reviewed, does appear that she has some sort of intrapelvic process occurring with hydrosalpinx and pelvic stranding including around her bladder.  Suspect her symptoms are related perivesical inflammation rather than true bladder infection based on her negative UA/urine cultures.  No further antibiotics indicated.  Urine today appears to be contaminated but otherwise benign.  She was given the diagnosis of "IC" by her OB/GYN however we discussed today this is a diagnosis of exclusion.  Understanding of the possible intrapelvic process, it is more likely that she is experiencing bladder related symptoms secondary to this acute event rather than a chronic issue.  This is a new problem and not necessarily chronic or recurring as one would expect with IC.  We discussed today planning to treat her symptoms  including dysuria urgency frequency with medications to alleviate each of the symptoms and urged her to follow-up with her OB/GYN as well to continue to address the possible underlying issue.  See below for details.  I also urged her to start a probiotic for C. difficile prevention.  2. Urinary urgency Discussed oxybutynin 10 mg XL for urgency frequency symptoms to take over the next month hopefully her symptoms will resolve  Possible side effects of constipation were discussed  3. Dysuria Uribel samples given, 40 tablets to take up to 4 times a day as needed for dysuria as well as prescription sent to pharmacy  She understands this is for symptom relief  4. Anxiety We discussed today I believe that anxiety is playing a huge role which she is absolutely in concurrence with, in fact she was want to mention this initially  We discussed that hydroxyzine can be used both for anxiety but as well as for bladder conditions less recommend continue this medication.  Generalized psychosocial stressors I have urged her to follow-up with her mental health provider.  We will plan to follow-up with her in the near future.  Like to try to wean her off of the above medications of her symptoms which are improving.  If her symptoms fail to resolve or worsen, we can consider further evaluation including cystoscopy amongst others.   Return in about 6 weeks (around 09/26/2019) for [facility].  Vanna Scotland, MD  [facility] [address], [address] [place], [place] [address] [phone]

## 2019-08-15 NOTE — Patient Instructions (Addendum)
Daily probiotic - to help reduce risk of C. diff  Will start oxybutynin 10 mg XL for bladder overactivity  Uribel for bladder pain/ burning with urination/ pinching

## 2019-08-20 NOTE — H&P (Signed)
Jordan Callahan is a 34 y.o. female here for Follow-up . Here for follow up for Chronic pelvic pain .  Marland Kitchenpt here for consult for 2 week h/o pelvic pain that came on with flushing + nausea . Pain bilateral lower for 2 weeks 8-9+ / 10 . She had a ctscan and u/s that showed a possible right hydrosalpinx and stranding anterior to the bladder . Pt menses heavy last lasting 5-7 days + clots . She has intermittent dyspareunia , worse recently . ++ urgency and nocturia and was treated for UTI recently .  S/p C/S x 2 + BTL  Prior h/o HSV and Chlamydia 4 yrs ago . Same partner 9 yrs .   Pt was referred to urology for possible IC . They feel that symptoms may be more to gyn issues . Dr Apolinar Junes did state if pain persists that a cystoscopy may be offered in the future. 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  Past Surgical History:  has a past surgical history that includes Dilation and curettage, diagnostic / therapeutic; Tubal ligation; and Cesarean section. Family History: family history includes Anxiety in her father and mother; Bipolar disorder in her mother; Depression in her father, mother, and sister; Diabetes type II in her maternal grandfather, maternal grandmother, and paternal grandmother; Glaucoma in her maternal grandmother; High blood pressure (Hypertension) in her father and maternal grandfather; Obesity in her father, maternal grandfather, maternal grandmother, mother, paternal grandmother, and sister; Stroke in her father and maternal  grandfather. Social History:  reports that she has been smoking cigarettes. She started smoking about 4 years ago. She has a 1.20 pack-year smoking history. She has never used smokeless tobacco. She reports current alcohol use. She reports that she does not use drugs. OB/GYN History:          OB History    Gravida  8   Para  2   Term      Preterm      AB  6   Living  2     SAB      TAB      Ectopic      Molar      Multiple      Live Births  2          Allergies: is allergic to penicillins and latex. Medications:  Current Outpatient Medications:  .  acetaminophen (TYLENOL) 325 MG tablet, Take 650 mg by mouth., Disp: , Rfl:  .  albuterol (PROVENTIL) 2.5 mg/0.5 mL nebulizer solution, Take 0.5 mLs (2.5 mg total) by nebulization every 4 (four) hours as needed for Wheezing, Disp: 30 each, Rfl: 11 .  albuterol 90 mcg/actuation inhaler, Inhale 2 inhalations into the lungs every 6 (six) hours as needed for Wheezing, Disp: 1 Inhaler, Rfl: 11 .  HYDROcodone-acetaminophen (NORCO) 5-325 mg tablet, 1 po q 8 hrs prn pain, Disp: 15 tablet, Rfl: 0 .  inhalational spacer (AEROCHAMBER) spacer, Use., Disp: , Rfl:  .  ondansetron (ZOFRAN) 8 MG tablet, Take 1 tablet (8 mg total) by mouth every 8 (eight) hours as needed for Nausea, Disp: 20 tablet, Rfl: 0 .  oxybutynin (DITROPAN-XL) 10 MG XL tablet, Take by mouth, Disp: , Rfl:  .  SUMAtriptan (IMITREX) 50 MG tablet, Take 1 tablet (50 mg total) by mouth as directed for Migraine May take a second dose after 2 hours if needed., Disp: 10 tablet, Rfl: 0 .  valACYclovir (VALTREX) 1000 MG tablet, Take 1 tablet (1,000 mg total) by mouth once daily, Disp: 30 tablet, Rfl: 11 .  venlafaxine (EFFEXOR-XR) 37.5 MG XR capsule, Take 1 capsule (37.5 mg total) by mouth once daily, Disp: 30 capsule, Rfl: 11 .  fluticasone propionate (FLOVENT HFA) 110 mcg/actuation inhaler, Inhale 1 inhalation into the lungs 2 (two) times daily for 30 days, Disp: 1  Inhaler, Rfl: 11  Review of Systems: General:                      No fatigue or weight loss Eyes:                           No vision changes Ears:                            No hearing difficulty Respiratory:                No cough or shortness of breath Pulmonary:                  No asthma or shortness of breath Cardiovascular:           No chest pain, palpitations, dyspnea on exertion Gastrointestinal:          No abdominal bloating, chronic diarrhea, constipations, masses, pain or hematochezia Genitourinary:             No hematuria,+ dysuria, abnormal vaginal discharge, +pelvic pain, Menometrorrhagia, + dyspareunia Lymphatic:                   No swollen lymph nodes Musculoskeletal:         No muscle weakness Neurologic:                  No extremity weakness, syncope, seizure disorder Psychiatric:                  No history of depression, delusions or suicidal/homicidal ideation    Exam:      Vitals:   08/18/19 1129  BP: 109/75  Pulse: 84    Body mass index is 33.59 kg/m.  WDWN white/ female in NAD   Lungs: CTA  CV : RRR without murmur   Neck:  no thyromegaly Abdomen: soft , no mass, normal active bowel sounds,  non-tender, no rebound tenderness Pelvic: tanner stage 5 ,  External genitalia: vulva /labia no lesions Urethra: no prolapse Vagina: normal physiologic d/c, adequate for LAVH  Cervix: no lesions, mild cervical motion tenderness   Uterus: normal size shape and contour, mobile , mild TTP  Adnexa: no mass,  Mild TTP  Rectovaginal:    Impression:   The primary encounter diagnosis was Pelvic pain in female. Diagnoses of Dysuria and Dyspareunia, female were also pertinent to this visit.    Plan:   I have spoken with the patient regarding treatment options including expectant management, hormonal options, or surgical intervention. After a full discussion the pt elects to proceed with LAVH , bilateral salpingectomy . She is aware that the  procedure may be converted to TAH  I  spoke to her what she can expect after surgery and the effect on  ovarian function . She is aware that urology follow up may be neccessary if after surgery she is still having pain         Marlis Oldaker Elenora Gamma, MD

## 2019-08-21 ENCOUNTER — Encounter
Admission: RE | Admit: 2019-08-21 | Discharge: 2019-08-21 | Disposition: A | Discharge status: Home / Self Care | Payer: Medicaid Other | Source: Ambulatory Visit | Attending: Obstetrics and Gynecology | Admitting: Obstetrics and Gynecology

## 2019-08-21 ENCOUNTER — Other Ambulatory Visit: Payer: Self-pay

## 2019-08-21 HISTORY — DX: Gastro-esophageal reflux disease without esophagitis: K21.9

## 2019-08-21 NOTE — Patient Instructions (Addendum)
Your procedure is scheduled on: 08/29/19 Report to [facility]. To find out your arrival time please call [phone] between 1PM - 3PM on 08/28/19.  Remember: Instructions that are not followed completely may result in serious medical risk, up to and including death, or upon the discretion of your surgeon and anesthesiologist your surgery may need to be rescheduled.     _X__ 1. Do not eat food after midnight the night before your procedure.                 No gum chewing or hard candies. You may drink clear liquids up to 2 hours                 before you are scheduled to arrive for your surgery- DO not drink clear                 liquids within 2 hours of the start of your surgery.                 Clear Liquids include:  water, apple juice without pulp, clear carbohydrate                 drink such as Clearfast or Gatorade, Black Coffee or Tea (Do not add                 anything to coffee or tea). Diabetics water only  __X__2.  On the morning of surgery brush your teeth with toothpaste and water, you                 may rinse your mouth with mouthwash if you wish.  Do not swallow any              toothpaste of mouthwash.     _X__ 3.  No Alcohol for 24 hours before or after surgery.   _X__ 4.  Do Not Smoke or use e-cigarettes For 24 Hours Prior to Your Surgery.                 Do not use any chewable tobacco products for at least 6 hours prior to                 surgery.  ____  5.  Bring all medications with you on the day of surgery if instructed.   __X__  6.  Notify your doctor if there is any change in your medical condition      (cold, fever, infections).     Do not wear jewelry, make-up, hairpins, clips or nail polish. Do not wear lotions, powders, or perfumes.  Do not shave 48 hours prior to surgery. Men may shave face and neck. Do not bring valuables to the hospital.    [facility] is not responsible for any belongings or  valuables.  Contacts, dentures/partials or body piercings may not be worn into surgery. Bring a case for your contacts, glasses or hearing aids, a denture cup will be supplied. Leave your suitcase in the car. After surgery it may be brought to your room. For patients admitted to the hospital, discharge time is determined by your treatment team.   Patients discharged the day of surgery will not be allowed to drive home.   Please read over the following fact sheets that you were given:   MRSA Information  __X__ Take these medicines the morning of surgery with A SIP OF WATER:  1. lamoTRIgine (LAMICTAL) 25 MG tablet  2. oxybutynin (DITROPAN-XL) 10 MG 24 hr tablet  3. May take hydrocodone if needed  4.  5.  6.  ____ Fleet Enema (as directed)   __X__ Use CHG Soap/SAGE wipes as directed  __X__ Use inhalers on the day of surgery  DO A NEBULIZER TREATMENT PRIOR TO ARRIVAL  ____ Stop metformin/Janumet/Farxiga 2 days prior to surgery    ____ Take 1/2 of usual insulin dose the night before surgery. No insulin the morning          of surgery.   ____ Stop Blood Thinners Coumadin/Plavix/Xarelto/Pleta/Pradaxa/Eliquis/Effient/Aspirin  on   Or contact your Surgeon, Cardiologist or Medical Doctor regarding  ability to stop your blood thinners  __X__ Stop Anti-inflammatories 7 days before surgery such as Advil, Ibuprofen, Motrin,  BC or Goodies Powder, Naprosyn, Naproxen, Aleve, Aspirin    __X__ Stop all herbal supplements, fish oil or vitamin E until after surgery.    ____ Bring C-Pap to the hospital.     ENSURE PRE SURGERY DRINK NEEDS TO BE FINISHED 2 HOURS BEFORE YOUR ARRIVAL TO SURGERY.  INCENTIVE SPIROMETER AND INSTRUCTIONS PROVIDED FOR USE AFTER SURGERY. PLEASE REVIEW INSTRUCTIONS AND PRACTICE USING THE DEVICE.

## 2019-08-27 ENCOUNTER — Other Ambulatory Visit: Payer: Self-pay

## 2019-08-27 ENCOUNTER — Other Ambulatory Visit
Admission: RE | Admit: 2019-08-27 | Discharge: 2019-08-27 | Disposition: A | Discharge status: Home / Self Care | Payer: Medicaid Other | Source: Ambulatory Visit | Attending: Obstetrics and Gynecology | Admitting: Obstetrics and Gynecology

## 2019-08-27 DIAGNOSIS — Z01812 Encounter for preprocedural laboratory examination: Secondary | ICD-10-CM | POA: Diagnosis present

## 2019-08-27 DIAGNOSIS — Z20822 Contact with and (suspected) exposure to covid-19: Secondary | ICD-10-CM | POA: Insufficient documentation

## 2019-08-27 LAB — BASIC METABOLIC PANEL
Anion gap: 10 (ref 5–15)
BUN: 10 mg/dL (ref 6–20)
CO2: 23 mmol/L (ref 22–32)
Calcium: 8.9 mg/dL (ref 8.9–10.3)
Chloride: 106 mmol/L (ref 98–111)
Creatinine, Ser: 0.64 mg/dL (ref 0.44–1.00)
GFR calc Af Amer: >60 mL/min (ref >60)
GFR calc non Af Amer: >60 mL/min (ref >60)
Glucose, Bld: 79 mg/dL (ref 70–99)
Potassium: 4.2 mmol/L (ref 3.5–5.1)
Sodium: 139 mmol/L (ref 135–145)

## 2019-08-27 LAB — CBC
HCT: 43.4 % (ref 36.0–46.0)
Hemoglobin: 14.5 g/dL (ref 12.0–15.0)
MCH: 31.1 pg (ref 26.0–34.0)
MCHC: 33.4 g/dL (ref 30.0–36.0)
MCV: 93.1 fL (ref 80.0–100.0)
Platelets: 295 10*3/uL (ref 150–400)
RBC: 4.66 MIL/uL (ref 3.87–5.11)
RDW: 12.3 % (ref 11.5–15.5)
WBC: 9.6 10*3/uL (ref 4.0–10.5)
nRBC: 0 % (ref 0.0–0.2)

## 2019-08-27 LAB — TYPE AND SCREEN
ABO/RH(D): A NEG
Antibody Screen: NEGATIVE

## 2019-08-27 LAB — SARS CORONAVIRUS 2 (TAT 6-24 HRS): SARS Coronavirus 2: NEGATIVE

## 2019-08-28 MED ORDER — CLINDAMYCIN PHOSPHATE 900 MG/50ML IV SOLN
900.0000 mg | INTRAVENOUS | Status: AC
  Administered 2019-08-29: 10:00:00 900 mg via INTRAVENOUS

## 2019-08-28 MED ORDER — GENTAMICIN SULFATE 40 MG/ML IJ SOLN
5.0000 mg/kg | INTRAVENOUS | Status: AC
  Administered 2019-08-29: 400 mg via INTRAVENOUS
  Filled 2019-08-28: qty 10

## 2019-08-29 ENCOUNTER — Encounter
Admission: RE | Disposition: A | Discharge status: Home / Self Care | Payer: Self-pay | Source: Home / Self Care | Attending: Obstetrics and Gynecology

## 2019-08-29 ENCOUNTER — Ambulatory Visit: Payer: Medicaid Other | Admitting: Anesthesiology

## 2019-08-29 ENCOUNTER — Ambulatory Visit
Admission: RE | Admit: 2019-08-29 | Discharge: 2019-08-29 | Disposition: A | Discharge status: Home / Self Care | Payer: Medicaid Other | Attending: Obstetrics and Gynecology | Admitting: Obstetrics and Gynecology

## 2019-08-29 ENCOUNTER — Other Ambulatory Visit: Payer: Self-pay

## 2019-08-29 ENCOUNTER — Encounter: Payer: Self-pay | Admitting: Obstetrics and Gynecology

## 2019-08-29 DIAGNOSIS — E669 Obesity, unspecified: Secondary | ICD-10-CM | POA: Diagnosis not present

## 2019-08-29 DIAGNOSIS — K219 Gastro-esophageal reflux disease without esophagitis: Secondary | ICD-10-CM | POA: Diagnosis not present

## 2019-08-29 DIAGNOSIS — Z79899 Other long term (current) drug therapy: Secondary | ICD-10-CM | POA: Diagnosis not present

## 2019-08-29 DIAGNOSIS — F4310 Post-traumatic stress disorder, unspecified: Secondary | ICD-10-CM | POA: Diagnosis not present

## 2019-08-29 DIAGNOSIS — N920 Excessive and frequent menstruation with regular cycle: Secondary | ICD-10-CM | POA: Diagnosis not present

## 2019-08-29 DIAGNOSIS — J45909 Unspecified asthma, uncomplicated: Secondary | ICD-10-CM | POA: Insufficient documentation

## 2019-08-29 DIAGNOSIS — F17210 Nicotine dependence, cigarettes, uncomplicated: Secondary | ICD-10-CM | POA: Insufficient documentation

## 2019-08-29 DIAGNOSIS — F319 Bipolar disorder, unspecified: Secondary | ICD-10-CM | POA: Insufficient documentation

## 2019-08-29 DIAGNOSIS — Z6833 Body mass index (BMI) 33.0-33.9, adult: Secondary | ICD-10-CM | POA: Diagnosis not present

## 2019-08-29 DIAGNOSIS — Z7951 Long term (current) use of inhaled steroids: Secondary | ICD-10-CM | POA: Diagnosis not present

## 2019-08-29 DIAGNOSIS — G8929 Other chronic pain: Secondary | ICD-10-CM | POA: Insufficient documentation

## 2019-08-29 DIAGNOSIS — N941 Unspecified dyspareunia: Secondary | ICD-10-CM | POA: Diagnosis not present

## 2019-08-29 DIAGNOSIS — R102 Pelvic and perineal pain: Secondary | ICD-10-CM | POA: Insufficient documentation

## 2019-08-29 DIAGNOSIS — N736 Female pelvic peritoneal adhesions (postinfective): Secondary | ICD-10-CM | POA: Diagnosis not present

## 2019-08-29 DIAGNOSIS — F419 Anxiety disorder, unspecified: Secondary | ICD-10-CM | POA: Diagnosis not present

## 2019-08-29 HISTORY — PX: LAPAROSCOPIC VAGINAL HYSTERECTOMY WITH SALPINGECTOMY: SHX6680

## 2019-08-29 HISTORY — PX: CYSTOSCOPY: SHX5120

## 2019-08-29 HISTORY — PX: LAPAROSCOPIC LYSIS OF ADHESIONS: SHX5905

## 2019-08-29 LAB — URINE DRUG SCREEN, QUALITATIVE (ARMC ONLY)
Amphetamines, Ur Screen: NOT DETECTED
Barbiturates, Ur Screen: NOT DETECTED
Benzodiazepine, Ur Scrn: NOT DETECTED
Cannabinoid 50 Ng, Ur ~~LOC~~: POSITIVE — AB
Cocaine Metabolite,Ur ~~LOC~~: NOT DETECTED
MDMA (Ecstasy)Ur Screen: NOT DETECTED
Methadone Scn, Ur: NOT DETECTED
Opiate, Ur Screen: POSITIVE — AB
Phencyclidine (PCP) Ur S: NOT DETECTED
Tricyclic, Ur Screen: NOT DETECTED

## 2019-08-29 LAB — POCT PREGNANCY, URINE: Preg Test, Ur: NEGATIVE

## 2019-08-29 LAB — ABO/RH: ABO/RH(D): A NEG

## 2019-08-29 SURGERY — HYSTERECTOMY, VAGINAL, LAPAROSCOPY-ASSISTED, WITH SALPINGECTOMY
Anesthesia: General | Laterality: Bilateral

## 2019-08-29 MED ORDER — KETOROLAC TROMETHAMINE 30 MG/ML IJ SOLN
INTRAMUSCULAR | Status: DC | PRN
  Administered 2019-08-29: 30 mg via INTRAVENOUS

## 2019-08-29 MED ORDER — SUGAMMADEX SODIUM 200 MG/2ML IV SOLN
INTRAVENOUS | Status: AC
  Filled 2019-08-29: qty 2

## 2019-08-29 MED ORDER — FAMOTIDINE 20 MG PO TABS
ORAL_TABLET | ORAL | Status: AC
  Administered 2019-08-29: 20 mg via ORAL
  Filled 2019-08-29: qty 1

## 2019-08-29 MED ORDER — MIDAZOLAM HCL 2 MG/2ML IJ SOLN
INTRAMUSCULAR | Status: AC
  Filled 2019-08-29: qty 2

## 2019-08-29 MED ORDER — MIDAZOLAM HCL 2 MG/2ML IJ SOLN
INTRAMUSCULAR | Status: DC | PRN
  Administered 2019-08-29: 2 mg via INTRAVENOUS

## 2019-08-29 MED ORDER — ONDANSETRON HCL 4 MG/2ML IJ SOLN
INTRAMUSCULAR | Status: AC
  Filled 2019-08-29: qty 2

## 2019-08-29 MED ORDER — ACETAMINOPHEN 500 MG PO TABS
ORAL_TABLET | ORAL | Status: AC
  Administered 2019-08-29: 1000 mg via ORAL
  Filled 2019-08-29: qty 2

## 2019-08-29 MED ORDER — FENTANYL CITRATE (PF) 100 MCG/2ML IJ SOLN
INTRAMUSCULAR | Status: DC | PRN
  Administered 2019-08-29: 25 ug via INTRAVENOUS
  Administered 2019-08-29 (×2): 50 ug via INTRAVENOUS
  Administered 2019-08-29: 25 ug via INTRAVENOUS

## 2019-08-29 MED ORDER — EPHEDRINE SULFATE 50 MG/ML IJ SOLN
INTRAMUSCULAR | Status: DC | PRN
  Administered 2019-08-29 (×2): 5 mg via INTRAVENOUS

## 2019-08-29 MED ORDER — ROCURONIUM BROMIDE 100 MG/10ML IV SOLN
INTRAVENOUS | Status: DC | PRN
  Administered 2019-08-29: 50 mg via INTRAVENOUS
  Administered 2019-08-29: 25 mg via INTRAVENOUS

## 2019-08-29 MED ORDER — METHYLENE BLUE 0.5 % INJ SOLN
INTRAVENOUS | Status: AC
  Filled 2019-08-29: qty 10

## 2019-08-29 MED ORDER — FENTANYL CITRATE (PF) 100 MCG/2ML IJ SOLN
INTRAMUSCULAR | Status: AC
  Filled 2019-08-29: qty 2

## 2019-08-29 MED ORDER — PROMETHAZINE HCL 25 MG/ML IJ SOLN
6.2500 mg | INTRAMUSCULAR | Status: AC | PRN
  Administered 2019-08-29: 12.5 mg via INTRAVENOUS

## 2019-08-29 MED ORDER — FENTANYL CITRATE (PF) 100 MCG/2ML IJ SOLN
25.0000 ug | INTRAMUSCULAR | Status: DC | PRN
  Administered 2019-08-29: 14:00:00 25 ug via INTRAVENOUS

## 2019-08-29 MED ORDER — ACETAMINOPHEN NICU IV SYRINGE 10 MG/ML
INTRAVENOUS | Status: AC
  Filled 2019-08-29: qty 1

## 2019-08-29 MED ORDER — PHENYLEPHRINE HCL (PRESSORS) 10 MG/ML IV SOLN
INTRAVENOUS | Status: DC | PRN
  Administered 2019-08-29: 100 ug via INTRAVENOUS

## 2019-08-29 MED ORDER — MEPERIDINE HCL 50 MG/ML IJ SOLN: 6.2500 mg | INTRAMUSCULAR | Status: DC | PRN

## 2019-08-29 MED ORDER — ONDANSETRON HCL 4 MG/2ML IJ SOLN
INTRAMUSCULAR | Status: DC | PRN
  Administered 2019-08-29: 4 mg via INTRAVENOUS

## 2019-08-29 MED ORDER — ACETAMINOPHEN 10 MG/ML IV SOLN
INTRAVENOUS | Status: DC | PRN
  Administered 2019-08-29: 1000 mg via INTRAVENOUS

## 2019-08-29 MED ORDER — LIDOCAINE-EPINEPHRINE 1 %-1:100000 IJ SOLN
INTRAMUSCULAR | Status: DC | PRN
  Administered 2019-08-29: 8 mL

## 2019-08-29 MED ORDER — LIDOCAINE HCL (CARDIAC) PF 100 MG/5ML IV SOSY
PREFILLED_SYRINGE | INTRAVENOUS | Status: DC | PRN
  Administered 2019-08-29: 80 mg via INTRAVENOUS

## 2019-08-29 MED ORDER — CLINDAMYCIN PHOSPHATE 900 MG/50ML IV SOLN
INTRAVENOUS | Status: AC
  Filled 2019-08-29: qty 50

## 2019-08-29 MED ORDER — BUPIVACAINE HCL 0.5 % IJ SOLN
INTRAMUSCULAR | Status: DC | PRN
  Administered 2019-08-29: 12 mL

## 2019-08-29 MED ORDER — FAMOTIDINE 20 MG PO TABS: 20.0000 mg | ORAL_TABLET | Freq: Once | ORAL | Status: AC

## 2019-08-29 MED ORDER — FLUORESCEIN SODIUM 10 % IV SOLN
INTRAVENOUS | Status: AC
  Filled 2019-08-29: qty 5

## 2019-08-29 MED ORDER — OXYCODONE-ACETAMINOPHEN 5-325 MG PO TABS
1.0000 | ORAL_TABLET | ORAL | Status: DC | PRN
  Administered 2019-08-29: 16:00:00 1 via ORAL

## 2019-08-29 MED ORDER — LIDOCAINE-EPINEPHRINE 1 %-1:100000 IJ SOLN
INTRAMUSCULAR | Status: AC
  Filled 2019-08-29: qty 1

## 2019-08-29 MED ORDER — OXYCODONE HCL 5 MG PO TABS: 5.0000 mg | ORAL_TABLET | Freq: Once | ORAL | Status: DC | PRN

## 2019-08-29 MED ORDER — DEXAMETHASONE SODIUM PHOSPHATE 10 MG/ML IJ SOLN
INTRAMUSCULAR | Status: DC | PRN
  Administered 2019-08-29: 5 mg via INTRAVENOUS

## 2019-08-29 MED ORDER — BUPIVACAINE HCL (PF) 0.5 % IJ SOLN
INTRAMUSCULAR | Status: AC
  Filled 2019-08-29: qty 30

## 2019-08-29 MED ORDER — SUCCINYLCHOLINE CHLORIDE 20 MG/ML IJ SOLN
INTRAMUSCULAR | Status: AC
  Filled 2019-08-29: qty 1

## 2019-08-29 MED ORDER — FLUORESCEIN SODIUM 10 % IV SOLN: INTRAVENOUS | Status: DC | PRN

## 2019-08-29 MED ORDER — KETOROLAC TROMETHAMINE 30 MG/ML IJ SOLN
INTRAMUSCULAR | Status: AC
  Filled 2019-08-29: qty 1

## 2019-08-29 MED ORDER — LIDOCAINE HCL (PF) 2 % IJ SOLN
INTRAMUSCULAR | Status: AC
  Filled 2019-08-29: qty 10

## 2019-08-29 MED ORDER — LACTATED RINGERS IV SOLN: INTRAVENOUS | Status: DC

## 2019-08-29 MED ORDER — PROMETHAZINE HCL 25 MG/ML IJ SOLN
INTRAMUSCULAR | Status: AC
  Administered 2019-08-29: 14:00:00 6.25 mg via INTRAVENOUS
  Filled 2019-08-29: qty 1

## 2019-08-29 MED ORDER — GABAPENTIN 300 MG PO CAPS
ORAL_CAPSULE | ORAL | Status: AC
  Administered 2019-08-29: 09:00:00 300 mg via ORAL
  Filled 2019-08-29: qty 1

## 2019-08-29 MED ORDER — ROCURONIUM BROMIDE 50 MG/5ML IV SOLN
INTRAVENOUS | Status: AC
  Filled 2019-08-29: qty 1

## 2019-08-29 MED ORDER — SUGAMMADEX SODIUM 200 MG/2ML IV SOLN
INTRAVENOUS | Status: DC | PRN
  Administered 2019-08-29: 200 mg via INTRAVENOUS

## 2019-08-29 MED ORDER — DEXAMETHASONE SODIUM PHOSPHATE 10 MG/ML IJ SOLN
INTRAMUSCULAR | Status: AC
  Filled 2019-08-29: qty 1

## 2019-08-29 MED ORDER — PROPOFOL 10 MG/ML IV BOLUS
INTRAVENOUS | Status: DC | PRN
  Administered 2019-08-29: 150 mg via INTRAVENOUS

## 2019-08-29 MED ORDER — GABAPENTIN 300 MG PO CAPS: 300.0000 mg | ORAL_CAPSULE | ORAL | Status: AC

## 2019-08-29 MED ORDER — OXYCODONE HCL 5 MG/5ML PO SOLN: 5.0000 mg | Freq: Once | ORAL | Status: DC | PRN

## 2019-08-29 MED ORDER — OXYCODONE-ACETAMINOPHEN 5-325 MG PO TABS
ORAL_TABLET | ORAL | Status: AC
  Filled 2019-08-29: qty 1

## 2019-08-29 MED ORDER — SODIUM CHLORIDE FLUSH 0.9 % IV SOLN
INTRAVENOUS | Status: AC
  Filled 2019-08-29: qty 10

## 2019-08-29 MED ORDER — ACETAMINOPHEN 500 MG PO TABS: 1000.0000 mg | ORAL_TABLET | ORAL | Status: AC

## 2019-08-29 SURGICAL SUPPLY — 48 items
BAG URINE DRAIN 2000ML AR STRL (UROLOGICAL SUPPLIES) ×3 IMPLANT
BLADE SURG SZ11 CARB STEEL (BLADE) ×3 IMPLANT
CATH FOLEY 2WAY  5CC 16FR (CATHETERS) ×1
CATH ROBINSON RED A/P 16FR (CATHETERS) ×3 IMPLANT
CATH URTH 16FR FL 2W BLN LF (CATHETERS) ×2 IMPLANT
CHLORAPREP W/TINT 26 (MISCELLANEOUS) ×3 IMPLANT
COVER WAND RF STERILE (DRAPES) ×3 IMPLANT
DEFOGGER SCOPE WARMER CLEARIFY (MISCELLANEOUS) ×1 IMPLANT
DRAPE SURG 17X11 SM STRL (DRAPES) ×3 IMPLANT
DRSG TEGADERM 2-3/8X2-3/4 SM (GAUZE/BANDAGES/DRESSINGS) ×12 IMPLANT
ELECT REM PT RETURN 9FT ADLT (ELECTROSURGICAL) ×3
ELECTRODE REM PT RTRN 9FT ADLT (ELECTROSURGICAL) ×2 IMPLANT
FILTER LAP SMOKE EVAC STRL (MISCELLANEOUS) ×3 IMPLANT
GAUZE 4X4 16PLY RFD (DISPOSABLE) ×3 IMPLANT
GLOVE BIO SURGEON STRL SZ8 (GLOVE) ×12 IMPLANT
GOWN STRL REUS W/ TWL LRG LVL3 (GOWN DISPOSABLE) ×6 IMPLANT
GOWN STRL REUS W/ TWL XL LVL3 (GOWN DISPOSABLE) ×2 IMPLANT
GOWN STRL REUS W/TWL LRG LVL3 (GOWN DISPOSABLE) ×3
GOWN STRL REUS W/TWL XL LVL3 (GOWN DISPOSABLE) ×1
GRASPER SUT TROCAR 14GX15 (MISCELLANEOUS) IMPLANT
IRRIGATION STRYKERFLOW (MISCELLANEOUS) ×2 IMPLANT
IRRIGATOR STRYKERFLOW (MISCELLANEOUS) ×3
IV LACTATED RINGERS 1000ML (IV SOLUTION) ×3 IMPLANT
KIT PINK PAD W/HEAD ARE REST (MISCELLANEOUS) ×3
KIT PINK PAD W/HEAD ARM REST (MISCELLANEOUS) ×2 IMPLANT
KIT TURNOVER CYSTO (KITS) ×3 IMPLANT
LABEL OR SOLS (LABEL) ×3 IMPLANT
NEEDLE HYPO 22GX1.5 SAFETY (NEEDLE) ×3 IMPLANT
PACK BASIN MINOR ARMC (MISCELLANEOUS) ×3 IMPLANT
PACK GYN LAPAROSCOPIC (MISCELLANEOUS) ×3 IMPLANT
PAD OB MATERNITY 4.3X12.25 (PERSONAL CARE ITEMS) ×3 IMPLANT
SHEARS HARMONIC ACE PLUS 36CM (ENDOMECHANICALS) ×1 IMPLANT
SLEEVE ENDOPATH XCEL 5M (ENDOMECHANICALS) ×7 IMPLANT
SPONGE GAUZE 2X2 8PLY STRL LF (GAUZE/BANDAGES/DRESSINGS) ×9 IMPLANT
STRIP CLOSURE SKIN 1/2X4 (GAUZE/BANDAGES/DRESSINGS) ×3 IMPLANT
SUT VIC AB 0 CT1 27 (SUTURE) ×2
SUT VIC AB 0 CT1 27XCR 8 STRN (SUTURE) ×4 IMPLANT
SUT VIC AB 0 CT1 36 (SUTURE) ×6 IMPLANT
SUT VIC AB 0 CT2 27 (SUTURE) ×3 IMPLANT
SUT VIC AB 2-0 SH 27 (SUTURE) ×1
SUT VIC AB 2-0 SH 27XBRD (SUTURE) ×2 IMPLANT
SUT VIC AB 2-0 UR6 27 (SUTURE) IMPLANT
SUT VIC AB 4-0 SH 27 (SUTURE) ×1
SUT VIC AB 4-0 SH 27XANBCTRL (SUTURE) ×2 IMPLANT
SYR 10ML LL (SYRINGE) ×3 IMPLANT
SYR CONTROL 10ML LL (SYRINGE) ×3 IMPLANT
TROCAR XCEL NON-BLD 5MMX100MML (ENDOMECHANICALS) ×3 IMPLANT
TUBING EVAC SMOKE HEATED PNEUM (TUBING) ×3 IMPLANT

## 2019-08-29 NOTE — Transfer of Care (Cosign Needed)
Immediate Anesthesia Transfer of Care Note  Patient: Jordan Callahan  Procedure(s) Performed: LAPAROSCOPIC ASSISTED VAGINAL HYSTERECTOMY WITH SALPINGECTOMY (Bilateral ) CYSTOSCOPY EXTENSIVE LAPAROSCOPIC LYSIS OF ADHESIONS  Patient Location: PACU  Anesthesia Type:General  Level of Consciousness: drowsy, patient cooperative and responds to stimulation  Airway & Oxygen Therapy: Patient Spontanous Breathing  Post-op Assessment: Report given to RN, Post -op Vital signs reviewed and stable and Patient moving all extremities X 4  Post vital signs: Reviewed and stable  Last Vitals:  Vitals Value Taken Time  BP 128/80 08/29/19 1339  Temp    Pulse 85 08/29/19 1342  Resp 13 08/29/19 1342  SpO2 97 % 08/29/19 1342  Vitals shown include unvalidated device data.  Last Pain:  Vitals:   08/29/19 0851  TempSrc: Tympanic  PainSc: 0-No pain         Complications: No apparent anesthesia complications

## 2019-08-29 NOTE — Anesthesia Postprocedure Evaluation (Signed)
Anesthesia Post Note  Patient: KERLINE TRAHAN  Procedure(s) Performed: LAPAROSCOPIC ASSISTED VAGINAL HYSTERECTOMY WITH SALPINGECTOMY (Bilateral ) CYSTOSCOPY EXTENSIVE LAPAROSCOPIC LYSIS OF ADHESIONS  Patient location during evaluation: PACU Anesthesia Type: General Level of consciousness: awake and alert and oriented Pain management: pain level controlled Vital Signs Assessment: post-procedure vital signs reviewed and stable Respiratory status: spontaneous breathing, nonlabored ventilation and respiratory function stable Cardiovascular status: blood pressure returned to baseline and stable Postop Assessment: no signs of nausea or vomiting Anesthetic complications: no     Last Vitals:  Vitals:   08/29/19 1430 08/29/19 1434  BP:  127/70  Pulse: 82 69  Resp: 14 14  Temp:    SpO2: 99% 96%    Last Pain:  Vitals:   08/29/19 1430  TempSrc:   PainSc: Asleep                 Hoa Briggs

## 2019-08-29 NOTE — Anesthesia Procedure Notes (Signed)
Procedure Name: Intubation Date/Time: 08/29/2019 10:12 AM Performed by: Zetta Bills, CRNA Pre-anesthesia Checklist: Patient identified, Patient being monitored, Timeout performed, Emergency Drugs available and Suction available Patient Re-evaluated:Patient Re-evaluated prior to induction Oxygen Delivery Method: Circle system utilized Preoxygenation: Pre-oxygenation with 100% oxygen Induction Type: IV induction Ventilation: Mask ventilation without difficulty Laryngoscope Size: Mac and 3 Grade View: Grade I Tube type: Oral Tube size: 7.0 mm Number of attempts: 1 Airway Equipment and Method: Stylet Placement Confirmation: ETT inserted through vocal cords under direct vision,  positive ETCO2 and breath sounds checked- equal and bilateral Secured at: 22 cm Tube secured with: Tape Dental Injury: Teeth and Oropharynx as per pre-operative assessment

## 2019-08-29 NOTE — Brief Op Note (Signed)
08/29/2019  1:24 PM  PATIENT:  Jordan Callahan  34 y.o. female  PRE-OPERATIVE DIAGNOSIS:  chronic pelvic pain, right hydrosalpinx Menorrhagia , dyspareunia POST-OPERATIVE DIAGNOSIS:  chronic pelvic pain, , extensive pelvic/abdominal adhesions, menorrhagia  PROCEDURE:  Procedure(s): LAPAROSCOPIC ASSISTED VAGINAL HYSTERECTOMY WITH SALPINGECTOMY (Bilateral) CYSTOSCOPY EXTENSIVE LAPAROSCOPIC LYSIS OF ADHESIONS  SURGEON:  Surgeon(s) and Role:    * Anselmo Reihl, Ihor Austin, MD - Primary    * Christeen Douglas, MD - Assisting  PHYSICIAN ASSISTANT: CST  ASSISTANTS: none   ANESTHESIA:   general  EBL:  50 mL , IOf 700 cc uo 100 cc  BLOOD ADMINISTERED:none  DRAINS: none   LOCAL MEDICATIONS USED:  MARCAINE     SPECIMEN:  Source of Specimen:  cervix , uterus , bilateral fallopian tubes  DISPOSITION OF SPECIMEN:  PATHOLOGY  COUNTS:  YES  TOURNIQUET:  * No tourniquets in log *  DICTATION: .Other Dictation: Dictation Number verbal  PLAN OF CARE: Discharge to home after PACU  PATIENT DISPOSITION:  PACU - hemodynamically stable.   Delay start of Pharmacological VTE agent (>24hrs) due to surgical blood loss or risk of bleeding: not applicable

## 2019-08-29 NOTE — Progress Notes (Signed)
PAged and phoned and left a message for Dr. Feliberto Gottron as he wanted to see pt before discharge

## 2019-08-29 NOTE — Discharge Instructions (Addendum)
YOU HAD ONE PERCOCET FOR PAIN AT [place] AT 47 PM.      Laparoscopically Assisted Vaginal Hysterectomy, Care After This sheet gives you information about how to care for yourself after your procedure. Your health care provider may also give you more specific instructions. If you have problems or questions, contact your health care provider. What can I expect after the procedure? After the procedure, it is common to have:  Soreness and numbness in your incision areas.  Abdominal pain. You will be given pain medicine to control it.  Vaginal bleeding and discharge. You will need to use a sanitary napkin after this procedure.  Sore throat from the breathing tube that was inserted during surgery. Follow these instructions at home: Medicines  Take over-the-counter and prescription medicines only as told by your health care provider.  Do not take aspirin or ibuprofen. These medicines can cause bleeding.  Do not drive or use heavy machinery while taking prescription pain medicine.  Do not drive for 24 hours if you were given a medicine to help you relax (sedative) during the procedure. Incision care   Follow instructions from your health care provider about how to take care of your incisions. Make sure you: ? Wash your hands with soap and water before you change your bandage (dressing). If soap and water are not available, use hand sanitizer. ? Change your dressing as told by your health care provider. ? Leave stitches (sutures), skin glue, or adhesive strips in place. These skin closures may need to stay in place for 2 weeks or longer. If adhesive strip edges start to loosen and curl up, you may trim the loose edges. Do not remove adhesive strips completely unless your health care provider tells you to do that.  Check your incision area every day for signs of infection. Check for: ? Redness, swelling, or pain. ? Fluid or blood. ? Warmth. ? Pus or a bad smell. Activity  Get  regular exercise as told by your health care provider. You may be told to take short walks every day and go farther each time.  Return to your normal activities as told by your health care provider. Ask your health care provider what activities are safe for you.  Do not douche, use tampons, or have sexual intercourse for at least 6 weeks, or until your health care provider gives you permission.  Do not lift anything that is heavier than 10 lb (4.5 kg), or the limit that your health care provider tells you, until he or she says that it is safe. General instructions  Do not take baths, swim, or use a hot tub until your health care provider approves. Take showers instead of baths.  Do not drive for 24 hours if you received a sedative.  Do not drive or operate heavy machinery while taking prescription pain medicine.  To prevent or treat constipation while you are taking prescription pain medicine, your health care provider may recommend that you: ? Drink enough fluid to keep your urine clear or pale yellow. ? Take over-the-counter or prescription medicines. ? Eat foods that are high in fiber, such as fresh fruits and vegetables, whole grains, and beans. ? Limit foods that are high in fat and processed sugars, such as fried and sweet foods.  Keep all follow-up visits as told by your health care provider. This is important. Contact a health care provider if:  You have signs of infection, such as: ? Redness, swelling, or pain around your incision  sites. ? Fluid or blood coming from an incision. ? An incision that feels warm to the touch. ? Pus or a bad smell coming from an incision.  Your incision breaks open.  Your pain medicine is not helping.  You feel dizzy or light-headed.  You have pain or bleeding when you urinate.  You have persistent nausea and vomiting.  You have blood, pus, or a bad-smelling discharge from your vagina. Get help right away if:  You have a fever.  You  have severe abdominal pain.  You have chest pain.  You have shortness of breath.  You faint.  You have pain, swelling, or redness in your leg.  You have heavy bleeding from your vagina. Summary  After the procedure, it is common to have abdominal pain and vaginal bleeding.  You should not drive or lift heavy objects until your health care provider says that it is safe.  Contact your health care provider if you have any symptoms of infection, excessive vaginal bleeding, nausea, vomiting, or shortness of breath. This information is not intended to replace advice given to you by your health care provider. Make sure you discuss any questions you have with your health care provider. Document Revised: 06/08/2017 Document Reviewed: 08/22/2016 Elsevier Patient Education  2020 [employer]    AMBULATORY SURGERY  DISCHARGE INSTRUCTIONS   1) The drugs that you were given will stay in your system until tomorrow so for the next 24 hours you should not:  A) Drive an automobile B) Make any legal decisions C) Drink any alcoholic beverage   2) You may resume regular meals tomorrow.  Today it is better to start with liquids and gradually work up to solid foods.  You may eat anything you prefer, but it is better to start with liquids, then soup and crackers, and gradually work up to solid foods.   3) Please notify your doctor immediately if you have any unusual bleeding, trouble breathing, redness and pain at the surgery site, drainage, fever, or pain not relieved by medication.    4) Additional Instructions:        Please contact your physician with any problems or Same Day Surgery at [phone], Monday through Friday 6 am to 4 pm, or [place] [facility] number at [phone].

## 2019-08-29 NOTE — OR Nursing (Signed)
Dr Feliberto Gottron in to see pt @ 1526.

## 2019-08-29 NOTE — Progress Notes (Signed)
Pt here for LAVH and bilateral salpingectomy . NPO . lAbs reviewed . All questions answered . Proceed

## 2019-08-29 NOTE — Anesthesia Preprocedure Evaluation (Signed)
Anesthesia Evaluation  Patient identified by MRN, date of birth, ID band Patient awake    Reviewed: Allergy & Precautions, NPO status , Patient's Chart, lab work & pertinent test results  History of Anesthesia Complications Negative for: history of anesthetic complications  Airway Mallampati: II  TM Distance: >3 FB Neck ROM: Full    Dental  (+) Poor Dentition   Pulmonary asthma , Current Smoker and Patient abstained from smoking.,    breath sounds clear to auscultation- rhonchi (-) wheezing      Cardiovascular Exercise Tolerance: Good (-) hypertension(-) CAD, (-) Past MI, (-) Cardiac Stents and (-) CABG  Rhythm:Regular Rate:Normal - Systolic murmurs and - Diastolic murmurs    Neuro/Psych  Headaches, neg Seizures PSYCHIATRIC DISORDERS Anxiety Depression Bipolar Disorder    GI/Hepatic Neg liver ROS, GERD  ,  Endo/Other  negative endocrine ROSneg diabetes  Renal/GU negative Renal ROS     Musculoskeletal negative musculoskeletal ROS (+)   Abdominal (+) + obese,   Peds  Hematology negative hematology ROS (+)   Anesthesia Other Findings Past Medical History: 08/19/2015: Abdominal pain affecting pregnancy, antepartum No date: Anxiety No date: Asthma 11/22/2014: Asthma, allergic, moderate persistent, with acute  exacerbation 05/05/2013: Bipolar 1 disorder (HCC) 03/21/2015: Bulimia     Comment:  Overview:  Overview:  Remote history, no active issues               Associated with h/o GERD, pancreatitis, and stomach               ulcers ( ) Nutrition ( ) Low threshold to start PPI / H2               blocker if symptoms  Last Assessment & Plan:  Body mass               index is 33.86 kg/(m^2). without any active issues.                Nutrition and perinatal psych consults placed for               additional counseling and support in this pregnancy.                Reviewed recommended TW 08/19/2015: Decreased fetus movements  affecting management of mother in  third trimester     Comment:  Overview:  08/19/2015 Eval in L&D triage for decreased fm               and abd pains. NST R and AFI normal, FM increased while               in triage. Work up for abd pain negative - likely               musculoskeletal discomforts. Discharged with precautions,              comfort measures, kick counts. Monitor, reeval prn. 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/04/2019: Major depressive disorder, recurrent, moderate (HCC) 05/28/2019: Migraine without aura and without status migrainosus, not  intractable 05/05/2013: Mood disorder (HCC) 05/05/2013: Multiple personalities (HCC) 04/26/2015: Obesity (BMI 30-39.9)     Comment:  Overview:  Overview:  Hgb A1c 5.1 One hr OGTT at next               visit Overview:  Hgb A1c 5.1 One hr OGTT at next  visit 01/04/2015: Positive Chlamyida test     Comment:  Overview:  treated 03/01/2017: Post concussion syndrome 05/05/2013: PTSD (post-traumatic stress disorder) 01/03/2017: Red blood cell antibody positive   Reproductive/Obstetrics                             Anesthesia Physical Anesthesia Plan  ASA: II  Anesthesia Plan: General   Post-op Pain Management:    Induction: Intravenous  PONV Risk Score and Plan: 1 and Ondansetron, Dexamethasone and Midazolam  Airway Management Planned: Oral ETT  Additional Equipment:   Intra-op Plan:   Post-operative Plan: Extubation in OR  Informed Consent: I have reviewed the patients History and Physical, chart, labs and discussed the procedure including the risks, benefits and alternatives for the proposed anesthesia with the patient or authorized representative who has indicated his/her understanding and acceptance.     Dental  advisory given  Plan Discussed with: CRNA and Anesthesiologist  Anesthesia Plan Comments:         Anesthesia Quick Evaluation

## 2019-08-30 NOTE — Op Note (Signed)
NAME: Jordan Callahan, VADALA MEDICAL RECORD JK:[id number] ACCOUNT [id number] DATE OF BIRTH:1985-12-14 FACILITY: [facility] LOCATION: [facility] PHYSICIAN:Keene Gilkey Josefine Class, MD  OPERATIVE REPORT  DATE OF PROCEDURE:  08/29/2019  PREOPERATIVE DIAGNOSES: 1.  Chronic pelvic pain. 2.  Menorrhagia. 3.  Dyspareunia.  POSTOPERATIVE DIAGNOSES: 1.  Chronic pelvic pain. 2.  Menorrhagia. 3.  Dyspareunia. 4.  Extensive abdominopelvic adhesions.  PROCEDURE: 1.  Laparoscopic assisted vaginal hysterectomy. 2.  Bilateral salpingectomy. 3.  Extensive abdominopelvic adhesiolysis incorporating greater than 50% of total operating time. 4.  Cystoscopy.  ANESTHESIA:  General endotracheal anesthesia.  SURGEON:  Laverta Baltimore, MD.  FIRST ASSISTANT:  Benjaman Kindler, M.D.  INDICATIONS:  A 34 year old gravida 8, para 2 patient with now a 32-month history of significant lower bilateral pelvic pain.  Complains of significant dyspareunia and heavy menstrual cycles.  Recent CT scan showed a possible right hydrosalpinx and  stranding from the bladder.  Urologic workup negative thus far.  FINDINGS: Significant omental and uterine adhesions to the anterior abdominal wall .  Her fallopian tubes were torsed and adhesed bilaterally .  DESCRIPTION OF PROCEDURE:  After adequate general endotracheal anesthesia, the patient was placed in dorsal supine position with the legs in the Eden stirrups.  The patient's abdomen, perineum and vagina were prepped and draped in normal sterile  fashion.  Timeout was performed.  The patient did receive gentamicin and clindamycin for surgical prophylaxis.  Speculum was placed into the vagina and the anterior cervix was grasped with a single tooth tenaculum and a Kahn cannula was placed into the  endocervical canal to be used for uterine manipulation during the procedure.  Foley catheter was placed and yielded 75 mL clear urine.  Gloves were changed.  Attention was directed to  the patient's abdomen where a 5 mm infraumbilical incision was made.   The 5 mm hysteroscope was advanced into the abdominal cavity under direct visualization with the Optiview cannula.  The patient's abdomen was insufflated.  There were dense adhesions noted at the infraumbilical area, but these were circumvented with the  hysteroscope to look at the lower sidewalls.  A second port was placed at the right lower quadrant 3 cm medial to the right anterior iliac spine and a 5 mm trocar was advanced under direct visualization.  Similar procedure was repeated on the patient's  left lower quadrant.  Again, a 5 mm trocar was advanced under direct visualization, 3 cm medial to the left anterior iliac spine.  Given the amount of dense adhesions with the omentum at the infraumbilical position, a fourth port was placed at Palmer's  point in the left upper quadrant 2 fingerbreadths below the inferior rib and mid clavicle.  Under direct visualization, a 5 mm trocar was advanced.  Harmonic scalpel was brought up to the operative field, significant pelvic and abdominal adhesiolysis that took place.   Total operating time on the adhesiolysis was greater than 50% of total operating time.  The omental adhesions were removed followed by freeing up the patient's bilateral adnexa.  The uterus was densely adherent to the anterior abdominal wall approximately 7 cm in length and  meticulous dissection with the Harmonic scalpel and traction and countertraction ultimately freed the uterus from the abdominal wall and the anterior cul-de-sac.  The left fallopian tube was grasped at the fimbriated end and Harmonic scalpel was used to  dissect the mesosalpinx.  The left broad ligament was opened and ultimately the left uterine artery was skeletonized and identified, cauterized with the Kleppinger cautery and transected  with the Harmonic scalpel.  The anterior bladder flap, which had  been previously dissected from the dense adhesions  was continued to the cervical level.  Attention was directed to the patient's right adnexa where the right fimbriated end of the fallopian tube was grasped and the mesosalpinx and the right was dissected  all the way to the level of the cornua.  The right broad ligament was opened and again dense adhesions were noted and ultimately the right uterine artery was identified, cauterized and transected.  At this point, attention was directed vaginally, where  a weighted speculum was placed in the posterior vaginal vault.  A Foley catheter was removed and the anterior and posterior cervix were grasped with 2 thyroid tenacula.  Cervix was circumferentially injected with 1% lidocaine with 1:100,000 epinephrine.   An Allis clamp was placed in the posterior vaginal fornix and a direct posterior colpotomy incision was made, gaining entrance into the posterior cul-de-sac.  A long weighted bill speculum was placed in the posterior vaginal cul-de-sac and the  uterosacral ligaments were bilaterally clamped, transected, suture ligated and tagged for later identification.  The anterior cervix was circumferentially incised with the Bovie.  The anterior cul-de-sac was entered sharply.  Deaver retractor was placed  within to elevate the bladder anteriorly.  Of note, the patient had some edema of the right side of the vulva.  Some mild compression at this area deflated the area and there was no reaccumulation of edema or ecchymosis.  The cardinal ligaments were then  bilaterally clamped, transected, suture ligated with 0 Vicryl suture.  One additional bite on each side allowed for detachment of the remaining uterine tissue.  The uterus, after coring out the midportion with the Bovie, was ultimately delivered.  The  vaginal cuff appeared hemostatic.  One additional figure-of-eight suture required on the left side.  The vaginal cuff was then reapproximated with 0 Vicryl suture in a running nonlocking fashion.  The uterosacral  ligaments were plicated centrally and the  rest of the cuff was closed with the running 0 Vicryl suture.  Attention was then directed abdominally abdominal again and the patient's abdomen was insufflated and the laparoscope was again advanced into the abdominal cavity.  Good hemostasis was  noted.  Pedicles appeared normal.  Upper abdomen appeared normal.  The pressure was lowered to 7 mmHg and no additional bleeding was noted.  Pictures were taken.  A cystoscopy was then performed with lactated Ringer's as distending medium.  Ureteral  orifices were identified bilaterally with normal pluming of urine noted readily.  Bladder was drained and the procedure was terminated.  All 4 ports sites were closed with 4-0 Vicryl suture and sterile dressings were applied.  COMPLICATIONS:  There were no complications.  ESTIMATED BLOOD LOSS:  50 mL.  URINE OUTPUT:  100 mL.  INTRAOPERATIVE FLUIDS:  700 mL.  The patient was taken to recovery room in good condition.  The patient did receive 30 mg intravenous Toradol at the end of the case.  PN/NUANCE  D:08/29/2019 T:08/30/2019 JOB:[id number]

## 2019-09-03 LAB — SURGICAL PATHOLOGY

## 2019-09-29 ENCOUNTER — Ambulatory Visit: Payer: Medicaid Other | Admitting: Urology

## 2019-10-28 ENCOUNTER — Encounter: Payer: Self-pay | Admitting: Emergency Medicine

## 2019-10-28 ENCOUNTER — Ambulatory Visit
Admission: EM | Admit: 2019-10-28 | Discharge: 2019-10-28 | Disposition: A | Discharge status: Home / Self Care | Payer: Medicaid Other

## 2019-10-28 ENCOUNTER — Other Ambulatory Visit: Payer: Self-pay

## 2019-10-28 DIAGNOSIS — H1033 Unspecified acute conjunctivitis, bilateral: Secondary | ICD-10-CM | POA: Diagnosis not present

## 2019-10-28 MED ORDER — POLYMYXIN B-TRIMETHOPRIM 10000-0.1 UNIT/ML-% OP SOLN: 1.0000 [drp] | OPHTHALMIC | 0 refills | Status: AC

## 2019-10-28 NOTE — Discharge Instructions (Addendum)
It was very nice seeing you today in clinic. Thank you for entrusting me with your care.   Avoid rubbing/touching eyes. Use drops as prescribed. If vision changes, you will need to be seen by your eye doctor. Cool compresses will help soothe your eye. May use Tylenol and/or Ibuprofen as needed for discomfort.   Make arrangements to follow up with your regular doctor in 1 week for re-evaluation if not improving. If your symptoms/condition worsens, please seek follow up care either here or in the ER. Please remember, our [facility] providers are "right here with you" when you need [place].   Again, it was my pleasure to take care of you today. Thank you for choosing our clinic. I hope that you start to feel better quickly.   Quentin Mulling, MSN, APRN, FNP-C, CEN Advanced Practice Provider [facility]

## 2019-10-28 NOTE — ED Triage Notes (Signed)
Patient c/o bilateral eye itching, watering and drainage that started this morning.

## 2019-10-29 NOTE — ED Provider Notes (Signed)
[place], [place]   Name: Jordan Callahan DOB: 1985-10-16 MRN: [id number] CSN: [id number] PCP: [name], MD  Arrival date and time:  10/28/19 1935  Chief Complaint:  Eye Problem  NOTE: Prior to seeing the patient today, I have reviewed the triage nursing documentation and vital signs. Clinical staff has updated patient's PMH/PSHx, current medication list, and drug allergies/intolerances to ensure comprehensive history available to assist in medical decision making.   History:   HPI: Jordan Callahan is a 34 y.o. female who presents today with complaints of pain and redness in her BILATERAL eyes that began this morning. She denies any injury to the eye. Patient with excessive tearing. She has experienced yellow exudative crusting. Patient reports that her eyes feel "gritty" and like "sandpaper under her eyelids". Vision is blurred. Patient notes that eye irritation is not exacerbated by light (photophobia). She does not wear contact lenses. Patient has not tried any over the counter eye drops or pain medications to help reduce/relieve her current symptoms.   Past Medical History:  Diagnosis Date  . Abdominal pain affecting pregnancy, antepartum 08/19/2015  . Anxiety   . Asthma   . Asthma, allergic, moderate persistent, with acute exacerbation 11/22/2014  . Bipolar 1 disorder ([place]) 05/05/2013  . Bulimia 03/21/2015   Overview:  Overview:  Remote history, no active issues Associated with h/o GERD, pancreatitis, and stomach ulcers [ ]  Nutrition [ ]  Low threshold to start PPI / H2 blocker if symptoms  Last Assessment & Plan:  Body mass index is 33.86 kg/(m^2). without any active issues.  Nutrition and perinatal psych consults placed for additional counseling and support in this pregnancy.  Reviewed recommended TW  . Decreased fetus movements affecting management of mother in third trimester 08/19/2015   Overview:  08/19/2015 Eval in L&D triage for decreased fm and abd pains. NST R and AFI normal,  FM increased while in triage. Work up for abd pain negative - likely musculoskeletal discomforts. Discharged with precautions, comfort measures, kick counts. Monitor, reeval prn.  . DUB (dysfunctional uterine bleeding) 05/05/2013  . GERD (gastroesophageal reflux disease)   . History of Clostridium difficile infection 05/19/2014  . History of multiple miscarriages   . 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 Major depressive disorder, recurrent, moderate ([place]) 06/04/2019  . Migraine without aura and without status migrainosus, not intractable 05/28/2019  . Mood disorder ([place]) 05/05/2013  . 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 Obesity (BMI 30-39.9) 04/26/2015   Overview:  Overview:  Hgb A1c 5.1 One hr OGTT at next visit Overview:  Hgb A1c 5.1 One hr OGTT at next visit  . 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 Post concussion syndrome 03/01/2017  . PTSD (post-traumatic stress disorder) 05/05/2013  . Red blood cell antibody positive 01/03/2017    Past Surgical History:  Procedure Laterality Date  . ABDOMINAL HYSTERECTOMY    . CESAREAN SECTION    . CYSTOSCOPY  08/29/2019   Procedure: CYSTOSCOPY;  Surgeon: Schermerhorn, Gwen Her, MD;  Location: [facility] ORS;  Service: Gynecology;;  . LAPAROSCOPIC LYSIS OF ADHESIONS  08/29/2019   Procedure: EXTENSIVE LAPAROSCOPIC LYSIS OF ADHESIONS;  Surgeon: Schermerhorn, Gwen Her, MD;  Location: [facility] ORS;  Service: Gynecology;;  . LAPAROSCOPIC VAGINAL HYSTERECTOMY WITH SALPINGECTOMY Bilateral 08/29/2019   Procedure: LAPAROSCOPIC ASSISTED VAGINAL HYSTERECTOMY WITH SALPINGECTOMY;  Surgeon: Schermerhorn, Gwen Her, MD;  Location: [facility] ORS;  Service: Gynecology;  Laterality: Bilateral;  . TUBAL LIGATION      Family History  Problem Relation  Age of Onset  . Bladder Cancer Neg Hx   . Prostate cancer Neg Hx   . Kidney cancer Neg Hx     Social History   Tobacco Use  . Smoking status: Current Every Day Smoker    Packs/day: 0.50    Types:  Cigarettes  . Smokeless tobacco: Never Used  Substance Use Topics  . Alcohol use: Yes    Comment: occasionally  . Drug use: Not Currently    Types: Marijuana    Patient Active Problem List   Diagnosis Date Noted  . Major depressive disorder, recurrent, moderate ([facility]) 06/04/2019  . Migraine without aura and without status migrainosus, not intractable 05/28/2019  . Post concussion syndrome 03/01/2017  . 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 Postpartum care following cesarean delivery 10/14/2015  . Abdominal pain affecting pregnancy, antepartum 08/19/2015  . Decreased fetus movements affecting management of mother in third trimester 08/19/2015  . Obesity (BMI 30-39.9) 04/26/2015  . Foot swelling 04/15/2015  . Bulimia 03/21/2015  . 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 Allergic rhinitis 11/22/2014  . Asthma, allergic, moderate persistent, with acute exacerbation 11/22/2014  . Cigarette smoker 11/22/2014  . History of Clostridium difficile infection 05/19/2014  . Anxiety 05/05/2013  . Bipolar 1 disorder ([facility]) 05/05/2013  . DUB (dysfunctional uterine bleeding) 05/05/2013  . Headache 05/05/2013  . Mood disorder ([facility]) 05/05/2013  . 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 PTSD (post-traumatic stress disorder) 05/05/2013    Home Medications:    Current Meds  Medication Sig  . albuterol (PROVENTIL HFA;VENTOLIN HFA) 108 (90 BASE) MCG/ACT inhaler Inhale into the lungs every 6 (six) hours as needed for wheezing or shortness of breath.  . gabapentin (NEURONTIN) 300 MG capsule Take by mouth.  . [DISCONTINUED] lamoTRIgine (LAMICTAL) 25 MG tablet Take 25 mg by mouth daily.    Allergies:   Latex and Penicillins  Review of Systems (ROS):  Review of systems NEGATIVE unless otherwise noted in narrative H&P section.   Vital Signs: Today's Vitals   10/28/19 1955 10/28/19 1956 10/28/19 2018  BP: (!) 142/110    Pulse: 82    Resp: 18      Temp: 98.4 F (36.9 C)    TempSrc: Oral    SpO2: 99%    Weight:  170 lb (77.1 kg)   Height:  5' (1.524 m)   PainSc: 8   8     Physical Exam: Physical Exam  Constitutional: She is oriented to person, place, and time and well-developed, well-nourished, and in no distress.  HENT:  Head: Normocephalic and atraumatic.  Eyes: Pupils are equal, round, and reactive to light. EOM are normal. Right eye exhibits discharge and exudate. Left eye exhibits discharge and exudate. Right conjunctiva is injected. Left conjunctiva is injected.  Right Eye Distance: 20/25 uncorrected Left Eye Distance: 20/40 uncorrected  Bilateral Distance: 20/25 uncorrected  Cardiovascular: Normal rate and intact distal pulses.  Pulmonary/Chest: Effort normal. No respiratory distress.  Neurological: She is alert and oriented to person, place, and time. Gait normal.  Skin: Skin is warm and dry. No rash noted. She is not diaphoretic.  Psychiatric: Mood, memory, affect and judgment normal.  Nursing note and vitals reviewed.   Urgent Care Treatments / Results:   Orders Placed This Encounter  Procedures  . Visual acuity screening    LABS: PLEASE NOTE: all labs that were ordered this encounter are listed, however only abnormal results are displayed. Labs Reviewed - No data to  display  EKG: -None  RADIOLOGY: No results found.  PROCEDURES: Procedures  MEDICATIONS RECEIVED THIS VISIT: Medications - No data to display  PERTINENT CLINICAL COURSE NOTES/UPDATES:   Initial Impression / Assessment and Plan / Urgent Care Course:  Pertinent labs & imaging results that were available during my care of the patient were personally reviewed by me and considered in my medical decision making (see lab/imaging section of note for values and interpretations).  EVVIE BEHRMANN is a 34 y.o. female who presents to [facility] today with complaints of Eye Problem  Patient is well appearing overall in clinic today.  She does not appear to be in any acute distress. Presenting symptoms (see HPI) and exam as documented above. Symptoms with acute onset this morning. (+) excessive tearing with yellow exudative crusting. Will cover for a BILATERAL bacterial conjunctivitis using a 5 day course of Polytrim ophthalmic gtts. Patient encouraged to avoid rubbing/touching eyes. Cool compresses can help soothe eyes. May use Tylenol and/or Ibuprofen as needed for discomfort. Discussed that increased pain or changes to her vision will need to be evaluated by her eye care provider.   Current clinical condition warrants patient being out of work in order to recover from her current injury/illness as she works in Product/process development [employer] She was provided with the appropriate documentation to provide to her place of employment that will allow for her to RTW on 10/30/2019 with no restrictions.  Discussed follow up with primary care physician in 1 week for re-evaluation. I have reviewed the follow up and strict return precautions for any new or worsening symptoms. Patient is aware of symptoms that would be deemed urgent/emergent, and would thus require further evaluation either here or in the emergency department. At the time of discharge, she verbalized understanding and consent with the discharge plan as it was reviewed with her. All questions were fielded by provider and/or clinic staff prior to patient discharge.    Final Clinical Impressions / Urgent Care Diagnoses:   Final diagnoses:  Acute bacterial conjunctivitis of both eyes    New Prescriptions:  [place] Controlled Substance Registry consulted? Not Applicable  Meds ordered this encounter  Medications  . trimethoprim-polymyxin b (POLYTRIM) ophthalmic solution    Sig: Place 1 drop into both eyes every 4 (four) hours. X 5 days    Dispense:  10 mL    Refill:  0    Recommended Follow up Care:  Patient encouraged to follow up with the following provider within the specified  time frame, or sooner as dictated by the severity of her symptoms. As always, she was instructed that for any urgent/emergent care needs, she should seek care either here or in the emergency department for more immediate evaluation.  Follow-up Information    Rayetta Humphrey, MD In 1 week.   Specialty: Family Medicine Why: General reassessment of symptoms if not improving Contact information: [address] [place] [address] [phone]         NOTE: This note was prepared using Dragon dictation software along with smaller phrase technology. Despite my best ability to proofread, there is the potential that transcriptional errors may still occur from this process, and are completely unintentional.    Verlee Monte, NP 10/29/19 2210

## 2019-11-10 ENCOUNTER — Emergency Department: Payer: Medicaid Other

## 2019-11-10 ENCOUNTER — Emergency Department
Admission: EM | Admit: 2019-11-10 | Discharge: 2019-11-10 | Disposition: A | Discharge status: Home / Self Care | Payer: Medicaid Other | Attending: Emergency Medicine | Admitting: Emergency Medicine

## 2019-11-10 ENCOUNTER — Other Ambulatory Visit: Payer: Self-pay

## 2019-11-10 ENCOUNTER — Encounter: Payer: Self-pay | Admitting: Emergency Medicine

## 2019-11-10 DIAGNOSIS — Z79899 Other long term (current) drug therapy: Secondary | ICD-10-CM | POA: Insufficient documentation

## 2019-11-10 DIAGNOSIS — Y9389 Activity, other specified: Secondary | ICD-10-CM | POA: Insufficient documentation

## 2019-11-10 DIAGNOSIS — S161XXA Strain of muscle, fascia and tendon at neck level, initial encounter: Secondary | ICD-10-CM

## 2019-11-10 DIAGNOSIS — S60222A Contusion of left hand, initial encounter: Secondary | ICD-10-CM

## 2019-11-10 DIAGNOSIS — F17210 Nicotine dependence, cigarettes, uncomplicated: Secondary | ICD-10-CM | POA: Insufficient documentation

## 2019-11-10 DIAGNOSIS — J45909 Unspecified asthma, uncomplicated: Secondary | ICD-10-CM | POA: Diagnosis not present

## 2019-11-10 DIAGNOSIS — Y999 Unspecified external cause status: Secondary | ICD-10-CM | POA: Diagnosis not present

## 2019-11-10 DIAGNOSIS — Y92410 Unspecified street and highway as the place of occurrence of the external cause: Secondary | ICD-10-CM | POA: Insufficient documentation

## 2019-11-10 DIAGNOSIS — S199XXA Unspecified injury of neck, initial encounter: Secondary | ICD-10-CM | POA: Diagnosis present

## 2019-11-10 MED ORDER — CYCLOBENZAPRINE HCL 10 MG PO TABS
10.0000 mg | ORAL_TABLET | Freq: Once | ORAL | Status: AC
  Administered 2019-11-10: 18:00:00 10 mg via ORAL
  Filled 2019-11-10: qty 1

## 2019-11-10 MED ORDER — HYDROCODONE-ACETAMINOPHEN 5-325 MG PO TABS: 1.0000 | ORAL_TABLET | Freq: Four times a day (QID) | ORAL | 0 refills | Status: AC | PRN

## 2019-11-10 MED ORDER — BACLOFEN 10 MG PO TABS: 10.0000 mg | ORAL_TABLET | Freq: Three times a day (TID) | ORAL | 0 refills | Status: AC

## 2019-11-10 MED ORDER — LORAZEPAM 0.5 MG PO TABS
0.5000 mg | ORAL_TABLET | Freq: Once | ORAL | Status: AC
  Administered 2019-11-10: 0.5 mg via ORAL
  Filled 2019-11-10: qty 1
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
## 2019-11-10 NOTE — Discharge Instructions (Signed)
Follow-up with your regular doctor if not improving.  Return emergency department if headache is worsening.  Take medications as prescribed.  If your neck is not better in 1 week you should see either orthopedics or chiropractor.

## 2024-01-03 ENCOUNTER — Ambulatory Visit: Admitting: Dietician
